# Patient Record
Sex: Male | Born: 1965 | Race: Black or African American | Hispanic: No | Marital: Married | Smoking: Never smoker
Health system: Southern US, Community
[De-identification: ages and names within clinical notes are randomized; demographics above are authoritative.]

## PROBLEM LIST (undated history)

## (undated) DIAGNOSIS — D72828 Other elevated white blood cell count: Secondary | ICD-10-CM

## (undated) DIAGNOSIS — D729 Disorder of white blood cells, unspecified: Secondary | ICD-10-CM

## (undated) DIAGNOSIS — I10 Essential (primary) hypertension: Secondary | ICD-10-CM

## (undated) HISTORY — DX: Disorder of white blood cells, unspecified: D72.9

## (undated) HISTORY — DX: Essential (primary) hypertension: I10

## (undated) HISTORY — DX: Other elevated white blood cell count: D72.828

---

## 2008-06-17 ENCOUNTER — Emergency Department (HOSPITAL_COMMUNITY): Admission: EM | Admit: 2008-06-17 | Discharge: 2008-06-17 | Payer: Self-pay | Admitting: Family Medicine

## 2008-06-21 ENCOUNTER — Emergency Department (HOSPITAL_COMMUNITY): Admission: EM | Admit: 2008-06-21 | Discharge: 2008-06-21 | Payer: Self-pay | Admitting: Emergency Medicine

## 2009-02-11 ENCOUNTER — Emergency Department (HOSPITAL_COMMUNITY): Admission: EM | Admit: 2009-02-11 | Discharge: 2009-02-11 | Payer: Self-pay | Admitting: Family Medicine

## 2009-02-13 ENCOUNTER — Emergency Department (HOSPITAL_COMMUNITY): Admission: EM | Admit: 2009-02-13 | Discharge: 2009-02-13 | Payer: Self-pay | Admitting: Family Medicine

## 2009-02-25 ENCOUNTER — Encounter: Payer: Self-pay | Admitting: Family Medicine

## 2010-05-20 ENCOUNTER — Ambulatory Visit: Payer: Self-pay | Admitting: Family Medicine

## 2010-05-20 DIAGNOSIS — I10 Essential (primary) hypertension: Secondary | ICD-10-CM | POA: Insufficient documentation

## 2010-05-20 HISTORY — DX: Essential (primary) hypertension: I10

## 2010-07-03 ENCOUNTER — Ambulatory Visit: Payer: Self-pay | Admitting: Family Medicine

## 2010-07-03 DIAGNOSIS — D72829 Elevated white blood cell count, unspecified: Secondary | ICD-10-CM

## 2010-07-03 LAB — CONVERTED CEMR LAB
ALT: 13 units/L (ref 0–53)
AST: 19 units/L (ref 0–37)
BUN: 9 mg/dL (ref 6–23)
Basophils Relative: 0.2 % (ref 0.0–3.0)
Bilirubin, Direct: 0 mg/dL (ref 0.0–0.3)
Chloride: 98 meq/L (ref 96–112)
Eosinophils Relative: 1.1 % (ref 0.0–5.0)
GFR calc non Af Amer: 88.87 mL/min (ref >60)
HCT: 39.3 % (ref 39.0–52.0)
MCV: 86.5 fL (ref 78.0–100.0)
Monocytes Relative: 6.5 % (ref 3.0–12.0)
Neutrophils Relative %: 15.5 % — ABNORMAL LOW (ref 43.0–77.0)
Platelets: 248 10*3/uL (ref 150.0–400.0)
Potassium: 3.7 meq/L (ref 3.5–5.1)
RBC: 4.55 M/uL (ref 4.22–5.81)
Total Bilirubin: 0.4 mg/dL (ref 0.3–1.2)
Total Protein: 7.1 g/dL (ref 6.0–8.3)
WBC: 22.2 10*3/uL (ref 4.5–10.5)

## 2010-07-14 ENCOUNTER — Telehealth: Payer: Self-pay | Admitting: Family Medicine

## 2010-07-14 ENCOUNTER — Ambulatory Visit: Payer: Self-pay | Admitting: Internal Medicine

## 2010-07-27 ENCOUNTER — Other Ambulatory Visit
Admission: RE | Admit: 2010-07-27 | Discharge: 2010-07-27 | Payer: Self-pay | Source: Home / Self Care | Attending: Internal Medicine | Admitting: Internal Medicine

## 2010-07-27 ENCOUNTER — Encounter: Payer: Self-pay | Admitting: Family Medicine

## 2010-07-27 LAB — COMPREHENSIVE METABOLIC PANEL
Albumin: 4.1 g/dL (ref 3.5–5.2)
Alkaline Phosphatase: 65 U/L (ref 39–117)
BUN: 14 mg/dL (ref 6–23)
Creatinine, Ser: 1.26 mg/dL (ref 0.40–1.50)
Glucose, Bld: 124 mg/dL — ABNORMAL HIGH (ref 70–99)
Potassium: 3.8 mEq/L (ref 3.5–5.3)
Total Bilirubin: 0.4 mg/dL (ref 0.3–1.2)

## 2010-07-27 LAB — CBC WITH DIFFERENTIAL/PLATELET
BASO%: 0.5 % (ref 0.0–2.0)
EOS%: 0.6 % (ref 0.0–7.0)
HCT: 39.2 % (ref 38.4–49.9)
LYMPH%: 79.6 % — ABNORMAL HIGH (ref 14.0–49.0)
MCH: 28.4 pg (ref 27.2–33.4)
MCHC: 33.1 g/dL (ref 32.0–36.0)
MCV: 86 fL (ref 79.3–98.0)
MONO%: 4.8 % (ref 0.0–14.0)
NEUT%: 14.5 % — ABNORMAL LOW (ref 39.0–75.0)
Platelets: 241 10*3/uL (ref 140–400)
RBC: 4.55 10*6/uL (ref 4.20–5.82)

## 2010-07-30 LAB — FLOW CYTOMETRY

## 2010-08-20 ENCOUNTER — Ambulatory Visit: Payer: Self-pay | Admitting: Internal Medicine

## 2010-09-15 NOTE — Assessment & Plan Note (Signed)
Summary: PT EST // RS   Vital Signs:  Patient profile:   45 year old male Weight:      281 pounds O2 Sat:      95 % Temp:     98.9 degrees F Pulse rate:   94 / minute BP sitting:   164 / 124  (left arm) Cuff size:   large  Vitals Entered By: Pura Spice, RN (May 20, 2010 11:39 AM) CC: to est transferring from Dr Benedetto Goad . Ran out BP meds 4 wks ago    History of Present Illness: 45 yr old male to establish with [place] after transfering from Dr. Benedetto Goad. He feels fine, but has had trouble keeping his BP down. He was diagnosed with HTN one year ago when he went to Urgent Care with HAs. At that time his BP was 240/160. He was started on Lisinopril HCT, and his BP came down nicely. He ran out of meds 4 weeks ago, however, so of course his BP is back up now. The last time it was checked before the meds ran out it was 130/90. He denies any HAs, SOB, or chest pains. He knows he needs to lose weight, and in fact he has lost about 20 lbs in the last 6 months. His last cpx with labs was in December 2010.   Preventive Screening-Counseling & Management  Alcohol-Tobacco     Smoking Status: never  Allergies (verified): No Known Drug Allergies  Past History:  Past Medical History: Hypertension  Past Surgical History: Denies surgical history  Family History: Reviewed history and no changes required. Family History Diabetes 1st degree relative Family History Hypertension  Social History: Reviewed history and no changes required. Occupation: Education officer, environmental Never Smoked Alcohol use-no Married Occupation:  employed Smoking Status:  never  Review of Systems  The patient denies anorexia, fever, weight gain, vision loss, decreased hearing, hoarseness, chest pain, syncope, dyspnea on exertion, peripheral edema, prolonged cough, headaches, hemoptysis, abdominal pain, melena, hematochezia, severe indigestion/heartburn, hematuria, incontinence, genital sores, muscle weakness, suspicious  skin lesions, transient blindness, difficulty walking, depression, unusual weight change, abnormal bleeding, enlarged lymph nodes, angioedema, breast masses, and testicular masses.    Physical Exam  General:  overweight-appearing.   Neck:  No deformities, masses, or tenderness noted. Lungs:  Normal respiratory effort, chest expands symmetrically. Lungs are clear to auscultation, no crackles or wheezes. Heart:  Normal rate and regular rhythm. S1 and S2 normal without gallop, murmur, click, rub or other extra sounds. Extremities:  no edema   Impression & Recommendations:  Problem # 1:  HYPERTENSION (ICD-401.9)  The following medications were removed from the medication list:    Lisinopril-hydrochlorothiazide 20-12.5 Mg Tabs (Lisinopril-hydrochlorothiazide) .Marland Kitchen... 1 once daily His updated medication list for this problem includes:    Losartan Potassium-hctz 100-12.5 Mg Tabs (Losartan potassium-hctz) ..... Once daily  Complete Medication List: 1)  Klor-con M20 20 Meq Cr-tabs (Potassium chloride crys cr) .Marland Kitchen.. 1 once daily 2)  Losartan Potassium-hctz 100-12.5 Mg Tabs (Losartan potassium-hctz) .... Once daily  Patient Instructions: 1)  Please schedule a follow-up appointment in 1 month.  2)  It is important that you exercise reguarly at least 20 minutes 5 times a week. If you develop chest pain, have severe difficulty breathing, or feel very tired, stop exercising immediately and seek medical attention.  3)  You need to lose weight. Consider a lower calorie diet and regular exercise.  4)  Advised him to keep his daily sodium intake below 2000  mg. Prescriptions: LOSARTAN POTASSIUM-HCTZ 100-12.5 MG TABS (LOSARTAN POTASSIUM-HCTZ) once daily  #30 x 11   Entered and Authorized by:   Nelwyn Salisbury MD   Signed by:   Nelwyn Salisbury MD on 05/20/2010   Method used:   Electronically to        [facility] [phone]* (retail)       [address]       [place], [place]  [address]       Ph: [phone] or  [phone]       Fax: [phone]   RxID:   [id number] KLOR-CON M20 20 MEQ CR-TABS (POTASSIUM CHLORIDE CRYS CR) 1 once daily  #30 x 11   Entered and Authorized by:   Nelwyn Salisbury MD   Signed by:   Nelwyn Salisbury MD on 05/20/2010   Method used:   Electronically to        [facility] [phone]* (retail)       [address]       [place], [place]  [address]       Ph: [phone] or [phone]       Fax: [phone]   RxID:   [id number]

## 2010-09-15 NOTE — Letter (Signed)
Summary: Records from [facility]  Records from [facility]   Imported By: Maryln Gottron 06/16/2010 13:18:11  _____________________________________________________________________  External Attachment:    Type:   Image     Comment:   External Document

## 2010-09-15 NOTE — Assessment & Plan Note (Signed)
Summary: 1 MONTH F/U//ALP/PT RSC/CJR----PT [facility] // RS   Vital Signs:  Patient profile:   45 year old male Weight:      290 pounds O2 Sat:      98 % Temp:     98.6 degrees F Pulse rate:   86 / minute BP sitting:   168 / 110  (left arm) Cuff size:   large  Vitals Entered By: Pura Spice, RN (July 03, 2010 10:11 AM) CC: reck BP    History of Present Illness: here to follow up on HTN. After we changed his meds last time, he has felt fine and he is trying to lose some weight. His BP has not changed much however.   Allergies: No Known Drug Allergies  Past History:  Past Medical History: Reviewed history from 05/20/2010 and no changes required. Hypertension  Review of Systems  The patient denies anorexia, fever, weight loss, weight gain, vision loss, decreased hearing, hoarseness, chest pain, syncope, dyspnea on exertion, peripheral edema, prolonged cough, headaches, hemoptysis, abdominal pain, melena, hematochezia, severe indigestion/heartburn, hematuria, incontinence, genital sores, muscle weakness, suspicious skin lesions, transient blindness, difficulty walking, depression, unusual weight change, abnormal bleeding, enlarged lymph nodes, angioedema, breast masses, and testicular masses.    Physical Exam  General:  overweight-appearing.   Neck:  No deformities, masses, or tenderness noted. Lungs:  Normal respiratory effort, chest expands symmetrically. Lungs are clear to auscultation, no crackles or wheezes. Heart:  Normal rate and regular rhythm. S1 and S2 normal without gallop, murmur, click, rub or other extra sounds.   Impression & Recommendations:  Problem # 1:  HYPERTENSION (ICD-401.9)  His updated medication list for this problem includes:    Losartan Potassium-hctz 100-12.5 Mg Tabs (Losartan potassium-hctz) ..... Once daily    Amlodipine Besylate 10 Mg Tabs (Amlodipine besylate) ..... Once daily  Orders: UA Dipstick w/o Micro (automated)   (81003) Venipuncture (21308) TLB-BMP (Basic Metabolic Panel-BMET) (80048-METABOL) TLB-CBC Platelet - w/Differential (85025-CBCD) TLB-Hepatic/Liver Function Pnl (80076-HEPATIC) TLB-TSH (Thyroid Stimulating Hormone) (84443-TSH)  Complete Medication List: 1)  Klor-con M20 20 Meq Cr-tabs (Potassium chloride crys cr) .Marland Kitchen.. 1 once daily 2)  Losartan Potassium-hctz 100-12.5 Mg Tabs (Losartan potassium-hctz) .... Once daily 3)  Amlodipine Besylate 10 Mg Tabs (Amlodipine besylate) .... Once daily  Patient Instructions: 1)  Please schedule a follow-up appointment in 1 month.  2)  It is important that you exercise reguarly at least 20 minutes 5 times a week. If you develop chest pain, have severe difficulty breathing, or feel very tired, stop exercising immediately and seek medical attention.  3)  You need to lose weight. Consider a lower calorie diet and regular exercise 4)  Add Amlodipine. 5)  get labs today Prescriptions: AMLODIPINE BESYLATE 10 MG TABS (AMLODIPINE BESYLATE) once daily  #30 x 11   Entered and Authorized by:   Nelwyn Salisbury MD   Signed by:   Nelwyn Salisbury MD on 07/03/2010   Method used:   [facility]ronically to        [facility] [phone][facility] (retail)       [address]       [place], [place]  [address]       Ph: [phone] or [phone]       Fax: [phone]   RxID:   [id number]    Orders Added: 1)  Est. Patient Level IV [41660] 2)  UA Dipstick w/o Micro (automated)  [81003] 3)  Venipuncture [63016] 4)  TLB-BMP (Basic Metabolic Panel-BMET) [80048-METABOL] 5)  TLB-CBC Platelet - w/Differential [85025-CBCD] 6)  TLB-Hepatic/Liver Function Pnl [80076-HEPATIC] 7)  TLB-TSH (Thyroid Stimulating Hormone) [84166-AYT]  Appended Document: Orders Update    Clinical Lists Changes  Orders: Added new Service order of Specimen Handling (01601) - Signed Observations: Added new observation of COMMENTS: Wynona Canes, CMA  July 03, 2010 12:10 PM  (07/03/2010 10:59) Added  new observation of PH URINE: 7.0  (07/03/2010 10:59) Added new observation of SPEC GR URIN: 1.020  (07/03/2010 10:59) Added new observation of APPEARANCE U: Clear  (07/03/2010 10:59) Added new observation of UA COLOR: yellow  (07/03/2010 10:59) Added new observation of WBC DIPSTK U: negative  (07/03/2010 10:59) Added new observation of NITRITE URN: negative  (07/03/2010 10:59) Added new observation of UROBILINOGEN: 0.2  (07/03/2010 10:59) Added new observation of PROTEIN, URN: negative  (07/03/2010 10:59) Added new observation of BLOOD UR DIP: 2+  (07/03/2010 10:59) Added new observation of KETONES URN: negative  (07/03/2010 10:59) Added new observation of BILIRUBIN UR: negative  (07/03/2010 10:59) Added new observation of GLUCOSE, URN: negative  (07/03/2010 10:59)      Laboratory Results   Urine Tests  Date/Time Recieved: July 03, 2010 12:10 PM  Date/Time Reported: July 03, 2010 12:10 PM   Routine Urinalysis   Color: yellow Appearance: Clear Glucose: negative   (Normal Range: Negative) Bilirubin: negative   (Normal Range: Negative) Ketone: negative   (Normal Range: Negative) Spec. Gravity: 1.020   (Normal Range: 1.003-1.035) Blood: 2+   (Normal Range: Negative) pH: 7.0   (Normal Range: 5.0-8.0) Protein: negative   (Normal Range: Negative) Urobilinogen: 0.2   (Normal Range: 0-1) Nitrite: negative   (Normal Range: Negative) Leukocyte Esterace: negative   (Normal Range: Negative)    Comments: Wynona Canes, CMA  July 03, 2010 12:10 PM      Appended Document: 1 MONTH F/U//ALP/PT RSC/CJR----PT [facility] // RS his urine tested positive for a little blood in it. We will retest with a more sensitive test the next time he comes in   Appended Document: 1 MONTH F/U//ALP/PT RSC/CJR----PT [facility] // RS pt aware of urine results

## 2010-09-15 NOTE — Progress Notes (Signed)
Summary: REQUEST FOR RETURN CALL  Phone Note Call from Patient   Caller: Patient [phone] Summary of Call: Pt called to speak with Almira Coaster, RN..... Pt adv that he has questions concerning recent labwork and referral to hematology.... Requests a return call to [phone].  Initial call taken by: Debbra Riding,  July 14, 2010 3:58 PM  Follow-up for Phone Call        left mess to return call  Follow-up by: Pura Spice, RN,  July 14, 2010 5:00 PM  Additional Follow-up for Phone Call Additional follow up Details #1::        pt never returned call  Additional Follow-up by: Pura Spice, RN,  July 17, 2010 9:24 AM

## 2010-09-17 NOTE — Letter (Signed)
Summary: [facility]  [facility]   Imported By: Maryln Gottron 08/13/2010 13:16:54  _____________________________________________________________________  External Attachment:    Type:   Image     Comment:   External Document

## 2010-11-23 LAB — POCT I-STAT, CHEM 8
Chloride: 100 mEq/L (ref 96–112)
Creatinine, Ser: 1.2 mg/dL (ref 0.4–1.5)
Hemoglobin: 15 g/dL (ref 13.0–17.0)
Potassium: 3 mEq/L — ABNORMAL LOW (ref 3.5–5.1)
Sodium: 142 mEq/L (ref 135–145)

## 2010-11-24 ENCOUNTER — Encounter: Payer: Self-pay | Admitting: Family Medicine

## 2010-11-26 ENCOUNTER — Ambulatory Visit: Payer: Self-pay | Admitting: Family Medicine

## 2010-12-04 ENCOUNTER — Other Ambulatory Visit: Payer: Self-pay | Admitting: Family Medicine

## 2010-12-04 ENCOUNTER — Ambulatory Visit (INDEPENDENT_AMBULATORY_CARE_PROVIDER_SITE_OTHER): Payer: 59 | Admitting: Family Medicine

## 2010-12-04 ENCOUNTER — Other Ambulatory Visit: Payer: 59

## 2010-12-04 ENCOUNTER — Encounter: Payer: Self-pay | Admitting: Family Medicine

## 2010-12-04 VITALS — BP 126/80 | HR 80 | Temp 98.5°F | Wt 281.0 lb

## 2010-12-04 DIAGNOSIS — D72829 Elevated white blood cell count, unspecified: Secondary | ICD-10-CM

## 2010-12-04 DIAGNOSIS — I10 Essential (primary) hypertension: Secondary | ICD-10-CM

## 2010-12-04 LAB — CBC WITH DIFFERENTIAL/PLATELET
Basophils Absolute: 0 10*3/uL (ref 0.0–0.1)
Eosinophils Absolute: 0.2 10*3/uL (ref 0.0–0.7)
Eosinophils Relative: 1 % (ref 0–5)
Lymphocytes Relative: 82 % — ABNORMAL HIGH (ref 12–46)
Lymphs Abs: 23 10*3/uL — ABNORMAL HIGH (ref 0.7–4.0)
MCH: 27.9 pg (ref 26.0–34.0)
MCV: 87.9 fL (ref 78.0–100.0)
Neutrophils Relative %: 14 % — ABNORMAL LOW (ref 43–77)
Platelets: 248 10*3/uL (ref 150–400)
RBC: 4.63 MIL/uL (ref 4.22–5.81)
RDW: 13.5 % (ref 11.5–15.5)
WBC: 27.9 10*3/uL — ABNORMAL HIGH (ref 4.0–10.5)

## 2010-12-04 LAB — POCT URINALYSIS DIPSTICK
Nitrite, UA: NEGATIVE
Spec Grav, UA: 1.02
Urobilinogen, UA: 0.2
pH, UA: 7

## 2010-12-04 LAB — BASIC METABOLIC PANEL
CO2: 33 mEq/L — ABNORMAL HIGH (ref 19–32)
Chloride: 98 mEq/L (ref 96–112)
Glucose, Bld: 85 mg/dL (ref 70–99)
Potassium: 3.6 mEq/L (ref 3.5–5.1)
Sodium: 141 mEq/L (ref 135–145)

## 2010-12-04 LAB — LDL CHOLESTEROL, DIRECT: Direct LDL: 99 mg/dL

## 2010-12-04 LAB — HEPATIC FUNCTION PANEL
AST: 19 U/L (ref 0–37)
Albumin: 3.9 g/dL (ref 3.5–5.2)
Alkaline Phosphatase: 59 U/L (ref 39–117)
Total Protein: 7.2 g/dL (ref 6.0–8.3)

## 2010-12-04 NOTE — Progress Notes (Signed)
  Subjective:    Patient ID: Joseph Greene, male    DOB: Jun 18, 1966, 45 y.o.   MRN: [id number]  HPI Here for HTN and to follow up leukocytosis. He is slowly losing weight, and his BP is stable. He feels good with no complaints. He saw Dr. Eugenia Mcalpine on 07-27-10 after we got a CBC showing a WBC of 22.2 and a lymphocytosis. He had no symptoms at that time. A repeat WBC count on 07-28-11 was 23.8 again with lymphocytosis.  Dr. Aura Fey thought this may represent chronic lymphocytic leukemia, but this was unclear. He recommended simply repeating a CBC in about 6 months. Unfortunately the patient has not returned for follow up until now.    Review of Systems  Constitutional: Negative.   Respiratory: Negative.   Cardiovascular: Negative.   Gastrointestinal: Negative.   Skin: Negative.   Hematological: Negative.        Objective:   Physical Exam  Constitutional: He appears well-developed and well-nourished.  Neck: No thyromegaly present.  Cardiovascular: Normal rate, regular rhythm, normal heart sounds and intact distal pulses.   Pulmonary/Chest: Effort normal and breath sounds normal.  Abdominal:       No supraclavicular or axillary or inguinal adenopathy  Lymphadenopathy:    He has no cervical adenopathy.          Assessment & Plan:  His HTN is stable. We will repeat a CBC today and go from there.

## 2010-12-07 ENCOUNTER — Telehealth: Payer: Self-pay | Admitting: Family Medicine

## 2010-12-07 NOTE — Progress Notes (Signed)
Pt Informed. Stressed the importance of f/u with Dr Mohammed @[facility] for possible Leukemia Dx. Gave Pt their phone#.  

## 2010-12-07 NOTE — Progress Notes (Signed)
Pt Informed. Stressed the importance of f/u with Dr Shirline Frees @[facility]  for possible Leukemia Dx. Gave Pt their phone#.

## 2010-12-07 NOTE — Telephone Encounter (Signed)
Message copied by Burnard Leigh on Mon Dec 07, 2010  9:46 AM ------      Message from: Dwaine Deter      Created: Mon Dec 07, 2010  8:30 AM       His WBC is still quite elevated, most of which are still lymphocytes. He needs to follow up with Dr. Shirline Frees in Oncology soon

## 2010-12-07 NOTE — Telephone Encounter (Signed)
Pt. Informed.

## 2010-12-23 ENCOUNTER — Encounter: Payer: 59 | Admitting: *Deleted

## 2011-06-04 ENCOUNTER — Other Ambulatory Visit: Payer: Self-pay | Admitting: Family Medicine

## 2011-06-07 NOTE — Telephone Encounter (Signed)
Script sent e-scribe 

## 2011-11-02 ENCOUNTER — Other Ambulatory Visit: Payer: Self-pay | Admitting: Family Medicine

## 2012-07-22 ENCOUNTER — Other Ambulatory Visit: Payer: Self-pay | Admitting: Family Medicine

## 2012-07-24 NOTE — Telephone Encounter (Signed)
Call in #30 only. He needs an OV  

## 2012-07-24 NOTE — Telephone Encounter (Signed)
Can we refill this? 

## 2012-09-11 ENCOUNTER — Telehealth: Payer: Self-pay | Admitting: Family Medicine

## 2012-09-11 NOTE — Telephone Encounter (Signed)
Pt BP has been running high. He had CPX at work, and his bp 214/124. He is there now, and is being watched by [facility] (on site). They want him to follow up w/ primary ASAP. Can I use a same day appt for him tomorrow ? Pls advise?

## 2012-09-11 NOTE — Telephone Encounter (Signed)
Pt aware/appt set 09/12/12 @ 11am

## 2012-09-11 NOTE — Telephone Encounter (Signed)
yes

## 2012-09-12 ENCOUNTER — Ambulatory Visit (INDEPENDENT_AMBULATORY_CARE_PROVIDER_SITE_OTHER): Payer: 59 | Admitting: Family Medicine

## 2012-09-12 ENCOUNTER — Encounter: Payer: Self-pay | Admitting: Family Medicine

## 2012-09-12 ENCOUNTER — Other Ambulatory Visit: Payer: Self-pay | Admitting: Family Medicine

## 2012-09-12 ENCOUNTER — Ambulatory Visit: Payer: 59

## 2012-09-12 VITALS — BP 172/120 | HR 102 | Temp 98.5°F | Wt 294.0 lb

## 2012-09-12 DIAGNOSIS — D72829 Elevated white blood cell count, unspecified: Secondary | ICD-10-CM

## 2012-09-12 DIAGNOSIS — I10 Essential (primary) hypertension: Secondary | ICD-10-CM

## 2012-09-12 LAB — TSH: TSH: 0.87 u[IU]/mL (ref 0.35–5.50)

## 2012-09-12 LAB — BASIC METABOLIC PANEL
BUN: 11 mg/dL (ref 6–23)
Calcium: 9.3 mg/dL (ref 8.4–10.5)
Chloride: 99 mEq/L (ref 96–112)
Creatinine, Ser: 1.1 mg/dL (ref 0.4–1.5)

## 2012-09-12 LAB — CBC WITH DIFFERENTIAL/PLATELET
Basophils Absolute: 0 10*3/uL (ref 0.0–0.1)
Eosinophils Relative: 0.5 % (ref 0.0–5.0)
HCT: 41 % (ref 39.0–52.0)
Lymphs Abs: 25 10*3/uL — ABNORMAL HIGH (ref 0.7–4.0)
Monocytes Absolute: 2 10*3/uL — ABNORMAL HIGH (ref 0.1–1.0)
Monocytes Relative: 6.6 % (ref 3.0–12.0)
Neutrophils Relative %: 10.9 % — ABNORMAL LOW (ref 43.0–77.0)
Platelets: 248 10*3/uL (ref 150.0–400.0)
RDW: 15.3 % — ABNORMAL HIGH (ref 11.5–14.6)
WBC: 30.5 10*3/uL (ref 4.5–10.5)

## 2012-09-12 MED ORDER — LOSARTAN POTASSIUM-HCTZ 100-25 MG PO TABS: 1.0000 | ORAL_TABLET | Freq: Every day | ORAL | Status: DC

## 2012-09-12 MED ORDER — AMLODIPINE BESYLATE 10 MG PO TABS: 10.0000 mg | ORAL_TABLET | Freq: Every day | ORAL | Status: DC

## 2012-09-12 NOTE — Progress Notes (Signed)
  Subjective:    Patient ID: Joseph Greene, male    DOB: 1966/08/03, 47 y.o.   MRN: [id number]  HPI Here for elevated BP. He was last seen here in April 2012, and his BP was well controlled at that time. He is supposed to be on one Losartan HCT a day and one Amlodipine a day. Unfortunately when he tried to refill the Amlodipine a month ago, he was given a bottle of Losartan HCT instead and he did not notice this. As a result he has been taking a double dose of Losartan HCT and no Amlodipine. He had a work screening yesterday and his BP was measured at 180 to 220 over 120. He has felt fine with no symptoms. Also he had been seeing Dr. Shirline Frees for leukocytosis, with the last WBC being 27.9 in April of 2012. 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 However he has not been back to Oncology since then. He admits to a poor diet lately, and he has gained 13 lbs since the last visit.    Review of Systems  Constitutional: Negative.   Respiratory: Negative.   Cardiovascular: Negative.        Objective:   Physical Exam  Constitutional: He appears well-developed and well-nourished. No distress.  Neck: No thyromegaly present.  Cardiovascular: Normal rate, regular rhythm, normal heart sounds and intact distal pulses.   Pulmonary/Chest: Effort normal and breath sounds normal.  Musculoskeletal: He exhibits no edema.  Lymphadenopathy:    He has no cervical adenopathy.          Assessment & Plan:  For the HTN, we will get back on Amlodipine 10 mg a day and we will change the Losartan HCT to 100/25 to take once a day. Recheck here in 3 weeks. Get labs today including a CBC.

## 2012-09-13 LAB — PATHOLOGIST SMEAR REVIEW

## 2012-09-14 ENCOUNTER — Telehealth: Payer: Self-pay | Admitting: Family Medicine

## 2012-09-14 DIAGNOSIS — I10 Essential (primary) hypertension: Secondary | ICD-10-CM

## 2012-09-14 DIAGNOSIS — E669 Obesity, unspecified: Secondary | ICD-10-CM

## 2012-09-14 NOTE — Telephone Encounter (Signed)
Pt left a voice message, wants the referral for a Nutritionist.

## 2012-09-14 NOTE — Telephone Encounter (Signed)
I left voice message with below information. 

## 2012-09-14 NOTE — Telephone Encounter (Signed)
The referral was done  

## 2012-09-18 ENCOUNTER — Telehealth: Payer: Self-pay | Admitting: *Deleted

## 2012-09-18 DIAGNOSIS — D72829 Elevated white blood cell count, unspecified: Secondary | ICD-10-CM

## 2012-09-18 MED ORDER — POTASSIUM CHLORIDE CRYS ER 20 MEQ PO TBCR: 20.0000 meq | EXTENDED_RELEASE_TABLET | Freq: Every day | ORAL | Status: DC

## 2012-09-18 NOTE — Addendum Note (Signed)
Addended by: Aniceto Boss A on: 09/18/2012 01:37 PM   Modules accepted: Orders

## 2012-09-18 NOTE — Progress Notes (Signed)
Quick Note:  I spoke with pt and sent script e-scribe, also released results to my chart. ______

## 2012-09-18 NOTE — Telephone Encounter (Signed)
Pt called stating he saw Dr Clent Ridges and his lab work is getting worse so he needs to see Dr Donnald Garre again.  Dr Donnald Garre reviewed his office note from his visit with pt 07/2010 and lab work that was done 09/12/12 by Dr Clent Ridges.  Per Dr Donnald Garre, okay for pt to f/u with lab and f/u in 2 weeks.  SLJ

## 2012-09-19 ENCOUNTER — Telehealth: Payer: Self-pay | Admitting: Internal Medicine

## 2012-09-19 NOTE — Telephone Encounter (Signed)
lmonvm adviisng the pt of his 2 week f/u appt with dr Arbutus Ped

## 2012-09-30 ENCOUNTER — Other Ambulatory Visit: Payer: Self-pay

## 2012-10-04 ENCOUNTER — Ambulatory Visit (HOSPITAL_BASED_OUTPATIENT_CLINIC_OR_DEPARTMENT_OTHER): Payer: 59 | Admitting: Internal Medicine

## 2012-10-04 ENCOUNTER — Encounter: Payer: Self-pay | Admitting: Internal Medicine

## 2012-10-04 ENCOUNTER — Other Ambulatory Visit: Payer: 59

## 2012-10-04 VITALS — BP 121/79 | HR 98 | Temp 98.2°F | Resp 20 | Ht 77.0 in | Wt 291.0 lb

## 2012-10-04 DIAGNOSIS — D72829 Elevated white blood cell count, unspecified: Secondary | ICD-10-CM

## 2012-10-04 DIAGNOSIS — D47Z9 Other specified neoplasms of uncertain behavior of lymphoid, hematopoietic and related tissue: Secondary | ICD-10-CM

## 2012-10-04 LAB — COMPREHENSIVE METABOLIC PANEL (CC13)
ALT: 18 U/L (ref 0–55)
AST: 16 U/L (ref 5–34)
Creatinine: 1.3 mg/dL (ref 0.7–1.3)
Sodium: 141 mEq/L (ref 136–145)
Total Bilirubin: 0.46 mg/dL (ref 0.20–1.20)
Total Protein: 8.2 g/dL (ref 6.4–8.3)

## 2012-10-04 LAB — MANUAL DIFFERENTIAL
Band Neutrophils: 1 % (ref 0–10)
EOS: 1 % (ref 0–7)
LYMPH: 83 % — ABNORMAL HIGH (ref 14–49)
MONO: 1 % (ref 0–14)
Other Cell: 0 % (ref 0–0)
SEG: 14 % — ABNORMAL LOW (ref 38–77)
nRBC: 0 % (ref 0–0)

## 2012-10-04 LAB — CBC WITH DIFFERENTIAL/PLATELET
MCH: 27.3 pg (ref 27.2–33.4)
MCV: 82.8 fL (ref 79.3–98.0)
RBC: 4.88 10*6/uL (ref 4.20–5.82)
RDW: 14.8 % — ABNORMAL HIGH (ref 11.0–14.6)
WBC: 36 10*3/uL — ABNORMAL HIGH (ref 4.0–10.3)

## 2012-10-04 NOTE — Patient Instructions (Signed)
You continue to have persistent leukocytosis questionable for chronic lymphocytic leukemia. Continue on observation for now with repeat CBC in 3 months

## 2012-10-04 NOTE — Progress Notes (Signed)
[facility] [phone]   [phone]  OFFICE PROGRESS NOTE  Nelwyn Salisbury, MD [address] 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  PRIOR THERAPY: None.  CURRENT THERAPY: None.  INTERVAL HISTORY: Joseph Greene 47 y.o. male returns to the clinic today for follow up visit. The patient was seen for evaluation of leukocytosis in December of 2011. 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 The patient was lost to follow up visit since that time because she was scared about a diagnosis of leukemia. He was seen recently by his primary care physician and repeat blood work showed persistent leukocytosis with increase in the number of the total white blood count compared to previous blood work. He was referred back to me today for evaluation and recommendation regarding his condition. He is feeling fine today with no specific complaints. He denied having any significant weight loss or night sweats. He denied having any chest pain, shortness breath, cough or hemoptysis. He has no palpable lymphadenopathy.  MEDICAL HISTORY: Past Medical History  Diagnosis Date  . HYPERTENSION 05/20/2010  . Neutrophilic leukocytosis     sees Dr. Shirline Frees     ALLERGIES:  has No Known Allergies.  MEDICATIONS:  Current Outpatient Prescriptions  Medication Sig Dispense Refill  . amLODipine (NORVASC) 10 MG tablet Take 1 tablet (10 mg total) by mouth daily.  30 tablet  11  . losartan-hydrochlorothiazide (HYZAAR) 100-25 MG per tablet Take 1 tablet by mouth daily.  30 tablet  11  . potassium chloride SA (K-DUR,KLOR-CON) 20 MEQ tablet Take 1 tablet (20 mEq total) by mouth daily.  30 tablet  11   No  current facility-administered medications for this visit.    REVIEW OF SYSTEMS:  A comprehensive review of systems was negative.   PHYSICAL EXAMINATION: General appearance: alert, cooperative and no distress Head: Normocephalic, without obvious abnormality, atraumatic Neck: no adenopathy Lymph nodes: Cervical, supraclavicular, and axillary nodes normal. Resp: clear to auscultation bilaterally Cardio: regular rate and rhythm, S1, S2 normal, no murmur, click, rub or gallop GI: soft, non-tender; bowel sounds normal; no masses,  no organomegaly Extremities: extremities normal, atraumatic, no cyanosis or edema Neurologic: Alert and oriented X 3, normal strength and tone. Normal symmetric reflexes. Normal coordination and gait  ECOG PERFORMANCE STATUS: 0 - Asymptomatic  Blood pressure 121/79, pulse 98, temperature 98.2 F (36.8 C), temperature source Oral, resp. rate 20, height 6\' 5"  (1.956 m), weight 291 lb (131.997 kg).  LABORATORY DATA: Lab Results  Component Value Date   WBC 30.5 Repeated and verified X2.* 09/12/2012   HGB 13.1 09/12/2012   HCT 41.0 09/12/2012   MCV 84.2 09/12/2012   PLT 248.0 09/12/2012      Chemistry      Component Value Date/Time   NA 139 09/12/2012 1157   K 3.2* 09/12/2012 1157   CL 99 09/12/2012 1157   CO2 33* 09/12/2012 1157   BUN 11 09/12/2012 1157   CREATININE 1.1 09/12/2012 1157      Component Value Date/Time   CALCIUM 9.3 09/12/2012 1157   ALKPHOS 59 12/04/2010 1304   AST 19 12/04/2010 1304   ALT 17 12/04/2010 1304   BILITOT 0.5 12/04/2010 1304       RADIOGRAPHIC STUDIES: No results found.  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  PLAN: I discussed the lab result with the patient. I recommended for him to continue on observation for  now with repeat CBC, comprehensive metabolic panel and LDH in 3 months. I would consider the patient for treatment if he has short doubling timeof his total white blood count less than 6 months.  All questions were answered. The patient knows to call the clinic with any problems, questions or concerns. We can certainly see the patient much sooner if necessary.  I spent 20 minutes counseling the patient face to face. The total time spent in the appointment was 30 minutes.

## 2012-10-04 NOTE — Progress Notes (Signed)
Quick Note:  Call patient with the result and order K Dur 20 meq po qd X 7 ______ 

## 2012-10-05 ENCOUNTER — Other Ambulatory Visit: Payer: Self-pay | Admitting: Physician Assistant

## 2012-10-05 ENCOUNTER — Telehealth: Payer: Self-pay | Admitting: Medical Oncology

## 2012-10-05 DIAGNOSIS — E876 Hypokalemia: Secondary | ICD-10-CM

## 2012-10-05 MED ORDER — POTASSIUM CHLORIDE CRYS ER 20 MEQ PO TBCR: 20.0000 meq | EXTENDED_RELEASE_TABLET | Freq: Every day | ORAL | Status: DC

## 2012-10-05 NOTE — Telephone Encounter (Signed)
Called in rx and left message for pt to pick up rx.

## 2012-10-05 NOTE — Telephone Encounter (Signed)
Message copied by Charma Igo on Thu Oct 05, 2012 10:15 AM ------      Message from: Si Gaul      Created: Wed Oct 04, 2012 11:50 PM       Call patient with the result and order K Dur 20 meq po qd X 7 ------

## 2012-10-07 ENCOUNTER — Telehealth: Payer: Self-pay | Admitting: *Deleted

## 2012-10-07 NOTE — Telephone Encounter (Signed)
I have called and left a message for the patient to call office for new scheduled appts. JMW

## 2012-10-09 ENCOUNTER — Telehealth: Payer: Self-pay | Admitting: *Deleted

## 2012-10-09 NOTE — Telephone Encounter (Signed)
I have called and left a message for the patient to call the office. Need to give patient appts for May.  JMW

## 2012-10-11 ENCOUNTER — Ambulatory Visit: Payer: 59 | Admitting: Dietician

## 2012-10-12 ENCOUNTER — Telehealth: Payer: Self-pay | Admitting: *Deleted

## 2012-10-12 NOTE — Telephone Encounter (Signed)
Mailed calendar   JMW

## 2012-10-12 NOTE — Telephone Encounter (Signed)
I have called and left message for the patient to call me regarding his appts. Need to give appts for May.  JMW

## 2012-10-16 ENCOUNTER — Other Ambulatory Visit: Payer: Self-pay | Admitting: Physician Assistant

## 2012-10-17 ENCOUNTER — Other Ambulatory Visit: Payer: Self-pay | Admitting: Physician Assistant

## 2012-10-23 ENCOUNTER — Encounter: Payer: Self-pay | Admitting: Family Medicine

## 2012-10-23 NOTE — Telephone Encounter (Signed)
I spoke with pharmacy and pt can transfer scripts.

## 2012-10-25 ENCOUNTER — Ambulatory Visit: Payer: 59 | Admitting: Dietician

## 2012-10-31 ENCOUNTER — Telehealth: Payer: Self-pay | Admitting: Family Medicine

## 2012-10-31 NOTE — Telephone Encounter (Signed)
Pt called and stated that he wanted to fu with you regarding transferring his medication, and a issue with his unemployment that you have been assisting him with.

## 2012-10-31 NOTE — Telephone Encounter (Signed)
Pt also needs all 3 scripts sent to [facility] pharmacy ( 90 day supply )

## 2012-11-01 ENCOUNTER — Ambulatory Visit: Payer: 59 | Admitting: *Deleted

## 2012-11-01 MED ORDER — LOSARTAN POTASSIUM-HCTZ 100-25 MG PO TABS: 1.0000 | ORAL_TABLET | Freq: Every day | ORAL | Status: DC

## 2012-11-01 MED ORDER — AMLODIPINE BESYLATE 10 MG PO TABS: 10.0000 mg | ORAL_TABLET | Freq: Every day | ORAL | Status: DC

## 2012-11-01 MED ORDER — POTASSIUM CHLORIDE CRYS ER 20 MEQ PO TBCR: 20.0000 meq | EXTENDED_RELEASE_TABLET | Freq: Every day | ORAL | Status: DC

## 2012-11-01 NOTE — Telephone Encounter (Signed)
I sent scripts e-scribe. 

## 2013-01-01 ENCOUNTER — Ambulatory Visit: Payer: 59 | Admitting: Internal Medicine

## 2013-01-01 ENCOUNTER — Telehealth: Payer: Self-pay | Admitting: Internal Medicine

## 2013-01-01 ENCOUNTER — Other Ambulatory Visit: Payer: 59 | Admitting: Lab

## 2013-01-01 NOTE — Telephone Encounter (Signed)
pt called to r/s appt to June.Joseph KitchenMarland KitchenMarland KitchenDone....pt ok and aware

## 2013-01-15 ENCOUNTER — Other Ambulatory Visit: Payer: Self-pay | Admitting: *Deleted

## 2013-01-15 DIAGNOSIS — D72829 Elevated white blood cell count, unspecified: Secondary | ICD-10-CM

## 2013-01-16 ENCOUNTER — Other Ambulatory Visit: Payer: 59

## 2013-01-16 ENCOUNTER — Ambulatory Visit: Payer: 59 | Admitting: Internal Medicine

## 2013-06-21 ENCOUNTER — Other Ambulatory Visit: Payer: Self-pay

## 2013-12-05 ENCOUNTER — Other Ambulatory Visit: Payer: Self-pay | Admitting: Family Medicine

## 2013-12-06 ENCOUNTER — Telehealth: Payer: Self-pay | Admitting: Family Medicine

## 2013-12-06 NOTE — Telephone Encounter (Signed)
Pt has cpx sch for 01-24-14. Pt will need refills on amlodipine 10 mg,losartan-hctz and potassium chloride 20 meq. Pt was given only 30 pills each [facility]

## 2013-12-10 NOTE — Telephone Encounter (Signed)
I spoke with pt and he will notify pharmacy a few days before he needs refill, I just sent in scripts last week.

## 2013-12-18 ENCOUNTER — Ambulatory Visit: Payer: 59 | Admitting: Family Medicine

## 2013-12-19 ENCOUNTER — Telehealth: Payer: Self-pay | Admitting: Family Medicine

## 2013-12-19 NOTE — Telephone Encounter (Signed)
Pt needs sooner cpx than June 2015. Can I create 30 min slot this month?

## 2013-12-19 NOTE — Telephone Encounter (Signed)
Pt has been sch

## 2013-12-19 NOTE — Telephone Encounter (Signed)
Yes, okay to schedule.

## 2013-12-28 ENCOUNTER — Ambulatory Visit (INDEPENDENT_AMBULATORY_CARE_PROVIDER_SITE_OTHER): Payer: 59 | Admitting: Family Medicine

## 2013-12-28 ENCOUNTER — Encounter: Payer: Self-pay | Admitting: Family Medicine

## 2013-12-28 VITALS — BP 120/80 | Temp 98.8°F | Ht 77.0 in | Wt 294.0 lb

## 2013-12-28 DIAGNOSIS — Z0000 Encounter for general adult medical examination without abnormal findings: Secondary | ICD-10-CM

## 2013-12-28 DIAGNOSIS — Z111 Encounter for screening for respiratory tuberculosis: Secondary | ICD-10-CM

## 2013-12-28 LAB — CBC WITH DIFFERENTIAL/PLATELET
BASOS PCT: 0 % (ref 0.0–3.0)
Basophils Absolute: 0 10*3/uL (ref 0.0–0.1)
EOS PCT: 0.4 % (ref 0.0–5.0)
Eosinophils Absolute: 0.2 10*3/uL (ref 0.0–0.7)
HCT: 39 % (ref 39.0–52.0)
Hemoglobin: 12.5 g/dL — ABNORMAL LOW (ref 13.0–17.0)
Lymphs Abs: 35.9 10*3/uL — ABNORMAL HIGH (ref 0.7–4.0)
MCHC: 32.1 g/dL (ref 30.0–36.0)
MCV: 86.5 fl (ref 78.0–100.0)
Monocytes Absolute: 1.7 10*3/uL — ABNORMAL HIGH (ref 0.1–1.0)
Monocytes Relative: 3.9 % (ref 3.0–12.0)
Neutro Abs: 5.2 10*3/uL (ref 1.4–7.7)
Neutrophils Relative %: 12.1 % — ABNORMAL LOW (ref 43.0–77.0)
Platelets: 263 10*3/uL (ref 150.0–400.0)
RBC: 4.51 Mil/uL (ref 4.22–5.81)
RDW: 14.9 % (ref 11.5–15.5)
WBC: 43 10*3/uL (ref 4.0–10.5)

## 2013-12-28 LAB — POCT URINALYSIS DIPSTICK
Bilirubin, UA: NEGATIVE
Glucose, UA: NEGATIVE
KETONES UA: NEGATIVE
LEUKOCYTES UA: NEGATIVE
Nitrite, UA: NEGATIVE
Protein, UA: NEGATIVE
Spec Grav, UA: 1.02
Urobilinogen, UA: 0.2
pH, UA: 6

## 2013-12-28 LAB — HEPATIC FUNCTION PANEL
ALK PHOS: 64 U/L (ref 39–117)
ALT: 16 U/L (ref 0–53)
AST: 24 U/L (ref 0–37)
Albumin: 4 g/dL (ref 3.5–5.2)
BILIRUBIN TOTAL: 0.6 mg/dL (ref 0.2–1.2)
Bilirubin, Direct: 0.1 mg/dL (ref 0.0–0.3)
Total Protein: 7.4 g/dL (ref 6.0–8.3)

## 2013-12-28 LAB — BASIC METABOLIC PANEL
BUN: 14 mg/dL (ref 6–23)
CHLORIDE: 97 meq/L (ref 96–112)
CO2: 32 mEq/L (ref 19–32)
Calcium: 9.2 mg/dL (ref 8.4–10.5)
Creatinine, Ser: 1.3 mg/dL (ref 0.4–1.5)
GFR: 77.33 mL/min (ref >60.00)
Glucose, Bld: 117 mg/dL — ABNORMAL HIGH (ref 70–99)
Potassium: 3.2 mEq/L — ABNORMAL LOW (ref 3.5–5.1)
Sodium: 140 mEq/L (ref 135–145)

## 2013-12-28 LAB — LIPID PANEL
CHOL/HDL RATIO: 5
Cholesterol: 168 mg/dL (ref 0–200)
HDL: 34 mg/dL — ABNORMAL LOW (ref >39.00)
LDL Cholesterol: 87 mg/dL (ref 0–99)
Triglycerides: 234 mg/dL — ABNORMAL HIGH (ref 0.0–149.0)
VLDL: 46.8 mg/dL — ABNORMAL HIGH (ref 0.0–40.0)

## 2013-12-28 LAB — TSH: TSH: 0.6 u[IU]/mL (ref 0.35–4.50)

## 2013-12-28 LAB — PSA: PSA: 0.86 ng/mL (ref 0.10–4.00)

## 2013-12-28 MED ORDER — LOSARTAN POTASSIUM-HCTZ 100-25 MG PO TABS: ORAL_TABLET | ORAL | Status: DC

## 2013-12-28 MED ORDER — AMLODIPINE BESYLATE 10 MG PO TABS: ORAL_TABLET | ORAL | Status: DC

## 2013-12-28 MED ORDER — POTASSIUM CHLORIDE CRYS ER 20 MEQ PO TBCR: EXTENDED_RELEASE_TABLET | ORAL | Status: DC

## 2013-12-28 NOTE — Progress Notes (Signed)
Pre visit review using our clinic review tool, if applicable. No additional management support is needed unless otherwise documented below in the visit note. 

## 2013-12-28 NOTE — Addendum Note (Signed)
Addended by: Aggie Hacker A on: 12/28/2013 11:59 AM   Modules accepted: Orders

## 2013-12-28 NOTE — Progress Notes (Signed)
   Subjective:    Patient ID: Joseph Greene, male    DOB: 1965-09-26, 48 y.o.   MRN: [id number]  HPI 48 yr old male for a cpx and to fill out a form for his job. He feels well except for some painful gums. He saw his dentist 2 months ago for painful bleeding gums and he was diagnosed with chronic gingivitis. He had a wisdom tooth removed at that visit and was placed on antibiotics for 2 weeks. Now he is scheduled to see Dr. Marian Sorrow in the next few weeks for a deep periodontal cleaning. Interestingly, he has been seeing Dr. Julien Nordmann for a leukocytosis of uncertain etiology, and his dentist thinks his chronic oral infections may be the cause.    Review of Systems  Constitutional: Negative.   HENT: Negative.   Eyes: Negative.   Respiratory: Negative.   Cardiovascular: Negative.   Gastrointestinal: Negative.   Genitourinary: Negative.   Musculoskeletal: Negative.   Skin: Negative.   Neurological: Negative.   Psychiatric/Behavioral: Negative.        Objective:   Physical Exam  Constitutional: He is oriented to person, place, and time. He appears well-developed and well-nourished. No distress.  HENT:  Head: Normocephalic and atraumatic.  Right Ear: External ear normal.  Left Ear: External ear normal.  Nose: Nose normal.  Mouth/Throat: Oropharynx is clear and moist. No oropharyngeal exudate.  Eyes: Conjunctivae and EOM are normal. Pupils are equal, round, and reactive to light. Right eye exhibits no discharge. Left eye exhibits no discharge. No scleral icterus.  Neck: Neck supple. No JVD present. No tracheal deviation present. No thyromegaly present.  Cardiovascular: Normal rate, regular rhythm, normal heart sounds and intact distal pulses.  Exam reveals no gallop and no friction rub.   No murmur heard. Pulmonary/Chest: Effort normal and breath sounds normal. No respiratory distress. He has no wheezes. He has no rales. He exhibits no tenderness.  Abdominal: Soft. Bowel sounds are normal.  He exhibits no distension and no mass. There is no tenderness. There is no rebound and no guarding.  Genitourinary: Rectum normal, prostate normal and penis normal. Guaiac negative stool. No penile tenderness.  Musculoskeletal: Normal range of motion. He exhibits no edema and no tenderness.  Lymphadenopathy:    He has no cervical adenopathy.  Neurological: He is alert and oriented to person, place, and time. He has normal reflexes. No cranial nerve deficit. He exhibits normal muscle tone. Coordination normal.  Skin: Skin is warm and dry. No rash noted. He is not diaphoretic. No erythema. No pallor.  Psychiatric: He has a normal mood and affect. His behavior is normal. Judgment and thought content normal.          Assessment & Plan:  Well exam. Get fasting labs. A PPD is placed. After he gets his gums taken care of, we will repeat a CBC to see if the WBC normalizes.

## 2013-12-31 LAB — TB SKIN TEST
INDURATION: 0 mm
TB Skin Test: NEGATIVE

## 2013-12-31 NOTE — Addendum Note (Signed)
Addended by: Aggie Hacker A on: 12/31/2013 05:16 PM   Modules accepted: Orders

## 2014-01-01 ENCOUNTER — Telehealth: Payer: Self-pay | Admitting: *Deleted

## 2014-01-01 ENCOUNTER — Other Ambulatory Visit: Payer: Self-pay | Admitting: *Deleted

## 2014-01-01 DIAGNOSIS — D72829 Elevated white blood cell count, unspecified: Secondary | ICD-10-CM

## 2014-01-01 NOTE — Progress Notes (Deleted)
Pt called to r/s appt that he missed last year.  Onc tx schedule filled out to see Dr Vista Mink in the next 2-3 weeks.  SLJ

## 2014-01-01 NOTE — Telephone Encounter (Signed)
Dr Sarajane Jews is recommending pt to f/u with Dr Vista Mink.  Pt Joseph Greene for appt in 01/2013.  Per Dr Vista Mink, okay to see pt in 3-4 weeks with labs.  Pt is aware that the schedulers will call the pt.  SLJ

## 2014-01-03 ENCOUNTER — Telehealth: Payer: Self-pay | Admitting: Internal Medicine

## 2014-01-03 NOTE — Telephone Encounter (Signed)
S/w the pt and he is aware of his June 2015 appts

## 2014-01-17 ENCOUNTER — Other Ambulatory Visit: Payer: 59

## 2014-01-24 ENCOUNTER — Telehealth: Payer: Self-pay | Admitting: Internal Medicine

## 2014-01-24 ENCOUNTER — Encounter: Payer: 59 | Admitting: Family Medicine

## 2014-01-24 NOTE — Telephone Encounter (Signed)
returned pt call and r/s appt per pt request...done...pt ok adn aware

## 2014-01-28 ENCOUNTER — Ambulatory Visit (INDEPENDENT_AMBULATORY_CARE_PROVIDER_SITE_OTHER): Payer: 59 | Admitting: Family

## 2014-01-28 ENCOUNTER — Encounter: Payer: Self-pay | Admitting: Family

## 2014-01-28 ENCOUNTER — Telehealth: Payer: Self-pay | Admitting: Family Medicine

## 2014-01-28 VITALS — BP 112/76 | HR 90 | Temp 98.4°F | Wt 298.0 lb

## 2014-01-28 DIAGNOSIS — M545 Low back pain, unspecified: Secondary | ICD-10-CM

## 2014-01-28 DIAGNOSIS — M5416 Radiculopathy, lumbar region: Secondary | ICD-10-CM

## 2014-01-28 DIAGNOSIS — IMO0002 Reserved for concepts with insufficient information to code with codable children: Secondary | ICD-10-CM

## 2014-01-28 MED ORDER — HYDROCODONE-ACETAMINOPHEN 10-325 MG PO TABS: 1.0000 | ORAL_TABLET | Freq: Three times a day (TID) | ORAL | Status: DC | PRN

## 2014-01-28 MED ORDER — DICLOFENAC SODIUM 75 MG PO TBEC: 75.0000 mg | DELAYED_RELEASE_TABLET | Freq: Two times a day (BID) | ORAL | Status: DC

## 2014-01-28 NOTE — Progress Notes (Signed)
Pre visit review using our clinic review tool, if applicable. No additional management support is needed unless otherwise documented below in the visit note. 

## 2014-01-28 NOTE — Progress Notes (Signed)
Subjective:    Patient ID: Joseph Greene, male    DOB: 12-16-1965, 48 y.o.   MRN: [id number]  Back Pain This is a new problem. The current episode started yesterday. The problem occurs constantly. The problem is unchanged. The pain is present in the lumbar spine. The quality of the pain is described as aching. The pain does not radiate. The pain is at a severity of 5/10. The pain is moderate. Worse during: worse with movement.  The symptoms are aggravated by bending, lying down, sitting and twisting. Pertinent negatives include no bowel incontinence, leg pain, numbness or weakness. He has tried heat for the symptoms. The treatment provided mild relief.   Patient reports moving furniture 1 month ago. Has had minimal "discomfort" since that time.    Review of Systems  Constitutional: Negative.   Respiratory: Negative.   Cardiovascular: Negative.   Gastrointestinal: Negative.  Negative for bowel incontinence.  Endocrine: Negative.   Genitourinary: Negative.   Musculoskeletal: Positive for back pain.  Skin: Negative.   Neurological: Negative.  Negative for weakness and numbness.  Psychiatric/Behavioral: Negative.    Past Medical History  Diagnosis Date  . HYPERTENSION 05/20/2010  . Neutrophilic leukocytosis     sees Dr. Earlie Server     History   Social History  . Marital Status: Married    Spouse Name: N/A    Number of Children: N/A  . Years of Education: N/A   Occupational History  . Not on file.   Social History Main Topics  . Smoking status: Never Smoker   . Smokeless tobacco: Never Used  . Alcohol Use: No  . Drug Use: No  . Sexual Activity: Not on file   Other Topics Concern  . Not on file   Social History Narrative  . No narrative on file    No past surgical history on file.  Family History  Problem Relation Age of Onset  . Diabetes    . Hypertension      No Known Allergies  Current Outpatient Prescriptions on File Prior to Visit  Medication Sig  Dispense Refill  . amLODipine (NORVASC) 10 MG tablet TAKE 1 TABLET BY MOUTH DAILY.  90 tablet  3  . losartan-hydrochlorothiazide (HYZAAR) 100-25 MG per tablet TAKE 1 TABLET BY MOUTH DAILY.  90 tablet  3  . potassium chloride SA (K-DUR,KLOR-CON) 20 MEQ tablet 20 mEq 2 (two) times daily.       No current facility-administered medications on file prior to visit.    BP 112/76  Pulse 90  Temp(Src) 98.4 F (36.9 C) (Oral)  Wt 298 lb (135.172 kg)  SpO2 98%chart    Objective:   Physical Exam  Constitutional: He is oriented to person, place, and time. He appears well-developed and well-nourished.  Neck: Normal range of motion. Neck supple.  Cardiovascular: Normal rate, regular rhythm and normal heart sounds.   Pulmonary/Chest: Effort normal and breath sounds normal.  Abdominal: Soft. Bowel sounds are normal.  Musculoskeletal: He exhibits tenderness. He exhibits no edema.  60 degree flexion at the hip before pain is elicited. No tenderness to palpation. Pain with rotation of the trunk. Positive SLR maneuver on the left.   Neurological: He is alert and oriented to person, place, and time.  Skin: Skin is warm and dry.  Psychiatric: He has a normal mood and affect.          Assessment & Plan:   Problem List Items Addressed This Visit   None    Visit  Diagnoses   Lumbar radiculopathy, acute    -  Primary    Low back pain        Relevant Medications       diclofenac (VOLTAREN) EC tablet       HYDROCODONE-ACETAMINOPHEN 10-325 MG PO TABS     Consider MRI of the lumbar spine if no better.

## 2014-01-28 NOTE — Patient Instructions (Signed)
Lumbosacral Radiculopathy Lumbosacral radiculopathy is a pinched nerve or nerves in the low back (lumbosacral area). When this happens you may have weakness in your legs and may not be able to stand on your toes. You may have pain going down into your legs. There may be difficulties with walking normally. There are many causes of this problem. Sometimes this may happen from an injury, or simply from arthritis or boney problems. It may also be caused by other illnesses such as diabetes. If there is no improvement after treatment, further studies may be done to find the exact cause. DIAGNOSIS  X-rays may be needed if the problems become long standing. Electromyograms may be done. This study is one in which the working of nerves and muscles is studied. HOME CARE INSTRUCTIONS   Applications of ice packs may be helpful. Ice can be used in a plastic bag with a towel around it to prevent frostbite to skin. This may be used every 2 hours for 20 to 30 minutes, or as needed, while awake, or as directed by your caregiver.  Only take over-the-counter or prescription medicines for pain, discomfort, or fever as directed by your caregiver.  If physical therapy was prescribed, follow your caregiver's directions. SEEK IMMEDIATE MEDICAL CARE IF:   You have pain not controlled with medications.  You seem to be getting worse rather than better.  You develop increasing weakness in your legs.  You develop loss of bowel or bladder control.  You have difficulty with walking or balance, or develop clumsiness in the use of your legs.  You have a fever. MAKE SURE YOU:   Understand these instructions.  Will watch your condition.  Will get help right away if you are not doing well or get worse. Document Released: 08/02/2005 Document Revised: 10/25/2011 Document Reviewed: 03/22/2008 [facility] Patient Information 2014 [place].   Low Back Sprain with Rehab  A sprain is an injury in which a ligament is  torn. The ligaments of the lower back are vulnerable to sprains. However, they are strong and require great force to be injured. These ligaments are important for stabilizing the spinal column. Sprains are classified into three categories. Grade 1 sprains cause pain, but the tendon is not lengthened. Grade 2 sprains include a lengthened ligament, due to the ligament being stretched or partially ruptured. With grade 2 sprains there is still function, although the function may be decreased. Grade 3 sprains involve a complete tear of the tendon or muscle, and function is usually impaired. SYMPTOMS   Severe pain in the lower back.  Sometimes, a feeling of a "pop," "snap," or tear, at the time of injury.  Tenderness and sometimes swelling at the injury site.  Uncommonly, bruising (contusion) within 48 hours of injury.  Muscle spasms in the back. CAUSES  Low back sprains occur when a force is placed on the ligaments that is greater than they can handle. Common causes of injury include:  Performing a stressful act while off-balance.  Repetitive stressful activities that involve movement of the lower back.  Direct hit (trauma) to the lower back. RISK INCREASES WITH:  Contact sports (football, wrestling).  Collisions (major skiing accidents).  Sports that require throwing or lifting (baseball, weightlifting).  Sports involving twisting of the spine (gymnastics, diving, tennis, golf).  Poor strength and flexibility.  Inadequate protection.  Previous back injury or surgery (especially fusion). PREVENTION  Wear properly fitted and padded protective equipment.  Warm up and stretch properly before activity.  Allow for  adequate recovery between workouts.  Maintain physical fitness:  Strength, flexibility, and endurance.  Cardiovascular fitness.  Maintain a healthy body weight. PROGNOSIS  If treated properly, low back sprains usually heal with non-surgical treatment. The length of  time for healing depends on the severity of the injury.  RELATED COMPLICATIONS   Recurring symptoms, resulting in a chronic problem.  Chronic inflammation and pain in the low back.  Delayed healing or resolution of symptoms, especially if activity is resumed too soon.  Prolonged impairment.  Unstable or arthritic joints of the low back. TREATMENT  Treatment first involves the use of ice and medicine, to reduce pain and inflammation. The use of strengthening and stretching exercises may help reduce pain with activity. These exercises may be performed at home or with a therapist. Severe injuries may require referral to a therapist for further evaluation and treatment, such as ultrasound. Your caregiver may advise that you wear a back brace or corset, to help reduce pain and discomfort. Often, prolonged bed rest results in greater harm then benefit. Corticosteroid injections may be recommended. However, these should be reserved for the most serious cases. It is important to avoid using your back when lifting objects. At night, sleep on your back on a firm mattress, with a pillow placed under your knees. If non-surgical treatment is unsuccessful, surgery may be needed.  MEDICATION   If pain medicine is needed, nonsteroidal anti-inflammatory medicines (aspirin and ibuprofen), or other minor pain relievers (acetaminophen), are often advised.  Do not take pain medicine for 7 days before surgery.  Prescription pain relievers may be given, if your caregiver thinks they are needed. Use only as directed and only as much as you need.  Ointments applied to the skin may be helpful.  Corticosteroid injections may be given by your caregiver. These injections should be reserved for the most serious cases, because they may only be given a certain number of times. HEAT AND COLD  Cold treatment (icing) should be applied for 10 to 15 minutes every 2 to 3 hours for inflammation and pain, and immediately after  activity that aggravates your symptoms. Use ice packs or an ice massage.  Heat treatment may be used before performing stretching and strengthening activities prescribed by your caregiver, physical therapist, or athletic trainer. Use a heat pack or a warm water soak. SEEK MEDICAL CARE IF:   Symptoms get worse or do not improve in 2 to 4 weeks, despite treatment.  You develop numbness or weakness in either leg.  You lose bowel or bladder function.  Any of the following occur after surgery: fever, increased pain, swelling, redness, drainage of fluids, or bleeding in the affected area.  New, unexplained symptoms develop. (Drugs used in treatment may produce side effects.) EXERCISES  RANGE OF MOTION (ROM) AND STRETCHING EXERCISES - Low Back Sprain Most people with lower back pain will find that their symptoms get worse with excessive bending forward (flexion) or arching at the lower back (extension). The exercises that will help resolve your symptoms will focus on the opposite motion.  Your physician, physical therapist or athletic trainer will help you determine which exercises will be most helpful to resolve your lower back pain. Do not complete any exercises without first consulting with your caregiver. Discontinue any exercises which make your symptoms worse, until you speak to your caregiver. If you have pain, numbness or tingling which travels down into your buttocks, leg or foot, the goal of the therapy is for these symptoms to move  closer to your back and eventually resolve. Sometimes, these leg symptoms will get better, but your lower back pain may worsen. This is often an indication of progress in your rehabilitation. Be very alert to any changes in your symptoms and the activities in which you participated in the 24 hours prior to the change. Sharing this information with your caregiver will allow him or her to most efficiently treat your condition. These exercises may help you when  beginning to rehabilitate your injury. Your symptoms may resolve with or without further involvement from your physician, physical therapist or athletic trainer. While completing these exercises, remember:   Restoring tissue flexibility helps normal motion to return to the joints. This allows healthier, less painful movement and activity.  An effective stretch should be held for at least 30 seconds.  A stretch should never be painful. You should only feel a gentle lengthening or release in the stretched tissue. FLEXION RANGE OF MOTION AND STRETCHING EXERCISES: STRETCH  Flexion, Single Knee to Chest   Lie on a firm bed or floor with both legs extended in front of you.  Keeping one leg in contact with the floor, bring your opposite knee to your chest. Hold your leg in place by either grabbing behind your thigh or at your knee.  Pull until you feel a gentle stretch in your low back. Hold __________ seconds.  Slowly release your grasp and repeat the exercise with the opposite side. Repeat __________ times. Complete this exercise __________ times per day.  STRETCH  Flexion, Double Knee to Chest  Lie on a firm bed or floor with both legs extended in front of you.  Keeping one leg in contact with the floor, bring your opposite knee to your chest.  Tense your stomach muscles to support your back and then lift your other knee to your chest. Hold your legs in place by either grabbing behind your thighs or at your knees.  Pull both knees toward your chest until you feel a gentle stretch in your low back. Hold __________ seconds.  Tense your stomach muscles and slowly return one leg at a time to the floor. Repeat __________ times. Complete this exercise __________ times per day.  STRETCH  Low Trunk Rotation  Lie on a firm bed or floor. Keeping your legs in front of you, bend your knees so they are both pointed toward the ceiling and your feet are flat on the floor.  Extend your arms out to the  side. This will stabilize your upper body by keeping your shoulders in contact with the floor.  Gently and slowly drop both knees together to one side until you feel a gentle stretch in your low back. Hold for __________ seconds.  Tense your stomach muscles to support your lower back as you bring your knees back to the starting position. Repeat the exercise to the other side. Repeat __________ times. Complete this exercise __________ times per day  EXTENSION RANGE OF MOTION AND FLEXIBILITY EXERCISES: STRETCH  Extension, Prone on Elbows   Lie on your stomach on the floor, a bed will be too soft. Place your palms about shoulder width apart and at the height of your head.  Place your elbows under your shoulders. If this is too painful, stack pillows under your chest.  Allow your body to relax so that your hips drop lower and make contact more completely with the floor.  Hold this position for __________ seconds.  Slowly return to lying flat on the floor.  Repeat __________ times. Complete this exercise __________ times per day.  RANGE OF MOTION  Extension, Prone Press Ups  Lie on your stomach on the floor, a bed will be too soft. Place your palms about shoulder width apart and at the height of your head.  Keeping your back as relaxed as possible, slowly straighten your elbows while keeping your hips on the floor. You may adjust the placement of your hands to maximize your comfort. As you gain motion, your hands will come more underneath your shoulders.  Hold this position __________ seconds.  Slowly return to lying flat on the floor. Repeat __________ times. Complete this exercise __________ times per day.  RANGE OF MOTION- Quadruped, Neutral Spine   Assume a hands and knees position on a firm surface. Keep your hands under your shoulders and your knees under your hips. You may place padding under your knees for comfort.  Drop your head and point your tailbone toward the ground below  you. This will round out your lower back like an angry cat. Hold this position for __________ seconds.  Slowly lift your head and release your tail bone so that your back sags into a large arch, like an old horse.  Hold this position for __________ seconds.  Repeat this until you feel limber in your low back.  Now, find your "sweet spot." This will be the most comfortable position somewhere between the two previous positions. This is your neutral spine. Once you have found this position, tense your stomach muscles to support your low back.  Hold this position for __________ seconds. Repeat __________ times. Complete this exercise __________ times per day.  STRENGTHENING EXERCISES - Low Back Sprain These exercises may help you when beginning to rehabilitate your injury. These exercises should be done near your "sweet spot." This is the neutral, low-back arch, somewhere between fully rounded and fully arched, that is your least painful position. When performed in this safe range of motion, these exercises can be used for people who have either a flexion or extension based injury. These exercises may resolve your symptoms with or without further involvement from your physician, physical therapist or athletic trainer. While completing these exercises, remember:   Muscles can gain both the endurance and the strength needed for everyday activities through controlled exercises.  Complete these exercises as instructed by your physician, physical therapist or athletic trainer. Increase the resistance and repetitions only as guided.  You may experience muscle soreness or fatigue, but the pain or discomfort you are trying to eliminate should never worsen during these exercises. If this pain does worsen, stop and make certain you are following the directions exactly. If the pain is still present after adjustments, discontinue the exercise until you can discuss the trouble with your caregiver. STRENGTHENING  Deep Abdominals, Pelvic Tilt   Lie on a firm bed or floor. Keeping your legs in front of you, bend your knees so they are both pointed toward the ceiling and your feet are flat on the floor.  Tense your lower abdominal muscles to press your low back into the floor. This motion will rotate your pelvis so that your tail bone is scooping upwards rather than pointing at your feet or into the floor. With a gentle tension and even breathing, hold this position for __________ seconds. Repeat __________ times. Complete this exercise __________ times per day.  STRENGTHENING  Abdominals, Crunches   Lie on a firm bed or floor. Keeping your legs in front of you, bend  your knees so they are both pointed toward the ceiling and your feet are flat on the floor. Cross your arms over your chest.  Slightly tip your chin down without bending your neck.  Tense your abdominals and slowly lift your trunk high enough to just clear your shoulder blades. Lifting higher can put excessive stress on the lower back and does not further strengthen your abdominal muscles.  Control your return to the starting position. Repeat __________ times. Complete this exercise __________ times per day.  STRENGTHENING  Quadruped, Opposite UE/LE Lift   Assume a hands and knees position on a firm surface. Keep your hands under your shoulders and your knees under your hips. You may place padding under your knees for comfort.  Find your neutral spine and gently tense your abdominal muscles so that you can maintain this position. Your shoulders and hips should form a rectangle that is parallel with the floor and is not twisted.  Keeping your trunk steady, lift your right hand no higher than your shoulder and then your left leg no higher than your hip. Make sure you are not holding your breath. Hold this position for __________ seconds.  Continuing to keep your abdominal muscles tense and your back steady, slowly return to your starting  position. Repeat with the opposite arm and leg. Repeat __________ times. Complete this exercise __________ times per day.  STRENGTHENING  Abdominals and Quadriceps, Straight Leg Raise   Lie on a firm bed or floor with both legs extended in front of you.  Keeping one leg in contact with the floor, bend the other knee so that your foot can rest flat on the floor.  Find your neutral spine, and tense your abdominal muscles to maintain your spinal position throughout the exercise.  Slowly lift your straight leg off the floor about 6 inches for a count of 15, making sure to not hold your breath.  Still keeping your neutral spine, slowly lower your leg all the way to the floor. Repeat this exercise with each leg __________ times. Complete this exercise __________ times per day. POSTURE AND BODY MECHANICS CONSIDERATIONS - Low Back Sprain Keeping correct posture when sitting, standing or completing your activities will reduce the stress put on different body tissues, allowing injured tissues a chance to heal and limiting painful experiences. The following are general guidelines for improved posture. Your physician or physical therapist will provide you with any instructions specific to your needs. While reading these guidelines, remember:  The exercises prescribed by your provider will help you have the flexibility and strength to maintain correct postures.  The correct posture provides the best environment for your joints to work. All of your joints have less wear and tear when properly supported by a spine with good posture. This means you will experience a healthier, less painful body.  Correct posture must be practiced with all of your activities, especially prolonged sitting and standing. Correct posture is as important when doing repetitive low-stress activities (typing) as it is when doing a single heavy-load activity (lifting). RESTING POSITIONS Consider which positions are most painful for you  when choosing a resting position. If you have pain with flexion-based activities (sitting, bending, stooping, squatting), choose a position that allows you to rest in a less flexed posture. You would want to avoid curling into a fetal position on your side. If your pain worsens with extension-based activities (prolonged standing, working overhead), avoid resting in an extended position such as sleeping on your stomach. Most  people will find more comfort when they rest with their spine in a more neutral position, neither too rounded nor too arched. Lying on a non-sagging bed on your side with a pillow between your knees, or on your back with a pillow under your knees will often provide some relief. Keep in mind, being in any one position for a prolonged period of time, no matter how correct your posture, can still lead to stiffness. PROPER SITTING POSTURE In order to minimize stress and discomfort on your spine, you must sit with correct posture. Sitting with good posture should be effortless for a healthy body. Returning to good posture is a gradual process. Many people can work toward this most comfortably by using various supports until they have the flexibility and strength to maintain this posture on their own. When sitting with proper posture, your ears will fall over your shoulders and your shoulders will fall over your hips. You should use the back of the chair to support your upper back. Your lower back will be in a neutral position, just slightly arched. You may place a small pillow or folded towel at the base of your lower back for  support.  When working at a desk, create an environment that supports good, upright posture. Without extra support, muscles tire, which leads to excessive strain on joints and other tissues. Keep these recommendations in mind: CHAIR:  A chair should be able to slide under your desk when your back makes contact with the back of the chair. This allows you to work  closely.  The chair's height should allow your eyes to be level with the upper part of your monitor and your hands to be slightly lower than your elbows. BODY POSITION  Your feet should make contact with the floor. If this is not possible, use a foot rest.  Keep your ears over your shoulders. This will reduce stress on your neck and low back. INCORRECT SITTING POSTURES  If you are feeling tired and unable to assume a healthy sitting posture, do not slouch or slump. This puts excessive strain on your back tissues, causing more damage and pain. Healthier options include:  Using more support, like a lumbar pillow.  Switching tasks to something that requires you to be upright or walking.  Talking a brief walk.  Lying down to rest in a neutral-spine position. PROLONGED STANDING WHILE SLIGHTLY LEANING FORWARD  When completing a task that requires you to lean forward while standing in one place for a long time, place either foot up on a stationary 2-4 inch high object to help maintain the best posture. When both feet are on the ground, the lower back tends to lose its slight inward curve. If this curve flattens (or becomes too large), then the back and your other joints will experience too much stress, tire more quickly, and can cause pain. CORRECT STANDING POSTURES Proper standing posture should be assumed with all daily activities, even if they only take a few moments, like when brushing your teeth. As in sitting, your ears should fall over your shoulders and your shoulders should fall over your hips. You should keep a slight tension in your abdominal muscles to brace your spine. Your tailbone should point down to the ground, not behind your body, resulting in an over-extended swayback posture.  INCORRECT STANDING POSTURES  Common incorrect standing postures include a forward head, locked knees and/or an excessive swayback. WALKING Walk with an upright posture. Your ears, shoulders and hips  should all line-up. PROLONGED ACTIVITY IN A FLEXED POSITION When completing a task that requires you to bend forward at your waist or lean over a low surface, try to find a way to stabilize 3 out of 4 of your limbs. You can place a hand or elbow on your thigh or rest a knee on the surface you are reaching across. This will provide you more stability, so that your muscles do not tire as quickly. By keeping your knees relaxed, or slightly bent, you will also reduce stress across your lower back. CORRECT LIFTING TECHNIQUES DO :  Assume a wide stance. This will provide you more stability and the opportunity to get as close as possible to the object which you are lifting.  Tense your abdominals to brace your spine. Bend at the knees and hips. Keeping your back locked in a neutral-spine position, lift using your leg muscles. Lift with your legs, keeping your back straight.  Test the weight of unknown objects before attempting to lift them.  Try to keep your elbows locked down at your sides in order get the best strength from your shoulders when carrying an object.  Always ask for help when lifting heavy or awkward objects. INCORRECT LIFTING TECHNIQUES DO NOT:   Lock your knees when lifting, even if it is a small object.  Bend and twist. Pivot at your feet or move your feet when needing to change directions.  Assume that you can safely pick up even a paperclip without proper posture. Document Released: 08/02/2005 Document Revised: 10/25/2011 Document Reviewed: 11/14/2008 [facility] Patient Information 2014 [place], [place].

## 2014-01-28 NOTE — Telephone Encounter (Signed)
Noted  

## 2014-01-28 NOTE — Telephone Encounter (Signed)
Patient Information:  Caller Name: Isacc  Phone: [phone]  Patient: Joseph Greene, Joseph Greene  Gender: Male  DOB: November 29, 1965  Age: 48 Years  PCP: Alysia Penna [facility])  Office Follow Up:  Does the office need to follow up with this patient?: No  Instructions For The Office: N/A  RN Note:  Patient calling with c/o back pain since bending over last PM. c/o Difficulty walking.  Denies chest pain.  No relief with OTC medication.  Symptoms  Reason For Call & Symptoms: c/o back pain after bending over  Reviewed Health History In EMR: Yes  Reviewed Medications In EMR: Yes  Reviewed Allergies In EMR: Yes  Reviewed Surgeries / Procedures: Yes  Date of Onset of Symptoms: 01/27/2014  Treatments Tried: OTC back aid MAX  Treatments Tried Worked: No  Guideline(s) Used:  Back Pain  Disposition Per Guideline:   See Today in Office  Reason For Disposition Reached:   Can't walk or can barely walk  Advice Given:  Call Back If:  You become worse.  Patient Will Follow Care Advice:  YES  Appointment Scheduled:  01/28/2014 14:15:42 Appointment Scheduled Provider:  Roxy Cedar [facility])

## 2014-01-31 ENCOUNTER — Ambulatory Visit: Payer: 59 | Admitting: Internal Medicine

## 2014-01-31 ENCOUNTER — Other Ambulatory Visit: Payer: 59

## 2014-02-11 ENCOUNTER — Telehealth: Payer: Self-pay | Admitting: Family Medicine

## 2014-02-11 MED ORDER — HYDROCODONE-ACETAMINOPHEN 10-325 MG PO TABS: 1.0000 | ORAL_TABLET | Freq: Three times a day (TID) | ORAL | Status: DC | PRN

## 2014-02-11 NOTE — Telephone Encounter (Signed)
Wrote for [employer] Also please call in Klor-con #[id number] with 3 rf

## 2014-02-11 NOTE — Telephone Encounter (Signed)
Pt is requesting a new rx for HYDROcodone-acetaminophen (NORCO) 10-325 MG per tablet Pt states he accidentally dropped his bottle of meds in the sink full of water and need a new rx for potassium chloride sa (k-dur, flor con) 20 meq,

## 2014-02-12 NOTE — Telephone Encounter (Signed)
Rx was done for Norco by pt's PCP, Dr. Sarajane Jews, on 02/11/14

## 2014-02-12 NOTE — Telephone Encounter (Signed)
Pt stated NP wrote rx for hydrocodone

## 2014-02-13 ENCOUNTER — Ambulatory Visit: Payer: 59 | Admitting: Internal Medicine

## 2014-02-13 ENCOUNTER — Other Ambulatory Visit: Payer: 59

## 2014-02-13 MED ORDER — POTASSIUM CHLORIDE CRYS ER 20 MEQ PO TBCR: 20.0000 meq | EXTENDED_RELEASE_TABLET | Freq: Two times a day (BID) | ORAL | Status: DC

## 2014-02-13 NOTE — Telephone Encounter (Signed)
Script for Norco is ready for pick up, Potassium was sent e-scribe to [place] and I left a voice message for pt.

## 2014-04-10 ENCOUNTER — Telehealth: Payer: Self-pay | Admitting: Family Medicine

## 2014-04-10 NOTE — Telephone Encounter (Signed)
I left a voice message for pt, [facility] can ask [facility] from [place]. We just sent in a years worth of refills on 3 of his medications.

## 2014-04-10 NOTE — Telephone Encounter (Signed)
Pt request refill on all his meds and request them to be sent to the following pharmacy    New pharmacy ; [facility]

## 2014-09-26 ENCOUNTER — Other Ambulatory Visit: Payer: Self-pay

## 2014-09-26 MED ORDER — AMLODIPINE BESYLATE 10 MG PO TABS: ORAL_TABLET | ORAL | Status: DC

## 2014-09-26 MED ORDER — LOSARTAN POTASSIUM-HCTZ 100-25 MG PO TABS: ORAL_TABLET | ORAL | Status: DC

## 2014-09-26 NOTE — Addendum Note (Signed)
Addended by: Colleen Can on: 09/26/2014 11:04 AM   Modules accepted: Orders

## 2014-09-26 NOTE — Telephone Encounter (Signed)
Rx request for amlodipine besylate 10 mg tablet-Take 1 tablet by mouth every day #90

## 2014-09-26 NOTE — Telephone Encounter (Signed)
Rx request for Losartan-HCTZ 100-25 mg tablet-Take 1 tablet by mouth every day #90  Pharm:  [facility]   Rx sent to pharmacy.

## 2015-03-04 ENCOUNTER — Other Ambulatory Visit: Payer: Self-pay | Admitting: *Deleted

## 2015-03-04 MED ORDER — POTASSIUM CHLORIDE CRYS ER 20 MEQ PO TBCR: 20.0000 meq | EXTENDED_RELEASE_TABLET | Freq: Two times a day (BID) | ORAL | Status: AC

## 2015-04-09 ENCOUNTER — Telehealth: Payer: Self-pay | Admitting: Family Medicine

## 2015-04-09 NOTE — Telephone Encounter (Signed)
Pt request refill of the following: amLODipine (NORVASC) 10 MG tablet ,  losartan-hydrochlorothiazide (HYZAAR) 100-25 MG per tablet potassium chloride SA (K-DUR,KLOR-CON) 20 MEQ tablet  Pt said he is out of medicine     [facility]: [facility] [place]

## 2015-04-10 MED ORDER — LOSARTAN POTASSIUM-HCTZ 100-25 MG PO TABS: ORAL_TABLET | ORAL | Status: AC

## 2015-04-10 MED ORDER — AMLODIPINE BESYLATE 10 MG PO TABS: ORAL_TABLET | ORAL | Status: DC

## 2015-04-10 NOTE — Telephone Encounter (Signed)
rx sent and patient is aware 

## 2015-06-07 ENCOUNTER — Emergency Department
Admission: EM | Admit: 2015-06-07 | Discharge: 2015-06-07 | Disposition: A | Discharge status: Home / Self Care | Payer: 59 | Source: Home / Self Care | Attending: Family Medicine | Admitting: Family Medicine

## 2015-06-07 DIAGNOSIS — R51 Headache: Secondary | ICD-10-CM

## 2015-06-07 DIAGNOSIS — B349 Viral infection, unspecified: Secondary | ICD-10-CM

## 2015-06-07 DIAGNOSIS — R519 Headache, unspecified: Secondary | ICD-10-CM

## 2015-06-07 DIAGNOSIS — R509 Fever, unspecified: Secondary | ICD-10-CM

## 2015-06-07 NOTE — ED Notes (Signed)
Patient states that he was diagnosed with the flu last week. He states he has fever, chills and a light headache.

## 2015-06-07 NOTE — Discharge Instructions (Signed)

## 2015-06-07 NOTE — ED Provider Notes (Signed)
CSN: [id number]     Arrival date & time 06/07/15  1124 History   First MD Initiated Contact with Patient 06/07/15 1143     Chief Complaint  Patient presents with  . Fever    fever and chills    (Consider location/radiation/quality/duration/timing/severity/associated sxs/prior Treatment) HPI  Pt is a 49yo male with hx of HTN presenting to [facility] with concern for elevated blood pressure after getting the flu last week.  Triage note states he was diagnosed with the flu, however, pt states he has not been evaluated by a medical provider for his symptoms he just "knows" he has the flu due to body aches, fatigue, fever and chills.  He also reports mild to moderate sore scratchy throat.  He has had generalized headaches as well which concern him for elevated BP but he has not checked it at home.  Due to his flu-like symptoms he has been taking Thera-flu and Nyquil which he believes is making his BP elevated. Denies cough, chest pain, sore throat, abdominal pain, or n/v/d. He did not get the flu vaccine this year as he states it "always" makes him very sick.  Denies recent travel. Does note his son was sick 1-2 weeks ago and believes he caught it from him. No hx of asthma.  Past Medical History  Diagnosis Date  . HYPERTENSION 05/20/2010  . Neutrophilic leukocytosis     sees Dr. Earlie Server    History reviewed. No pertinent past surgical history. Family History  Problem Relation Age of Onset  . Diabetes    . Hypertension     Social History  Substance Use Topics  . Smoking status: Never Smoker   . Smokeless tobacco: Never Used  . Alcohol Use: No    Review of Systems  Constitutional: Positive for fever, chills and fatigue.  HENT: Positive for rhinorrhea and sore throat. Negative for congestion, ear pain, trouble swallowing and voice change.   Respiratory: Negative for cough and shortness of breath.   Cardiovascular: Negative for chest pain and palpitations.  Gastrointestinal: Negative for nausea,  vomiting, abdominal pain and diarrhea.  Musculoskeletal: Positive for myalgias and arthralgias. Negative for back pain.       Body aches  Skin: Negative for rash.  All other systems reviewed and are negative.   Allergies  Review of patient's allergies indicates no known allergies.  Home Medications   Prior to Admission medications   Medication Sig Start Date End Date Taking? Authorizing Provider  amLODipine (NORVASC) 10 MG tablet TAKE 1 TABLET BY MOUTH DAILY. 04/10/15  Yes Laurey Morale, MD  losartan-hydrochlorothiazide (HYZAAR) 100-25 MG per tablet TAKE 1 TABLET BY MOUTH DAILY. 04/10/15  Yes Laurey Morale, MD  potassium chloride SA (K-DUR,KLOR-CON) 20 MEQ tablet Take 1 tablet (20 mEq total) by mouth 2 (two) times daily. 03/04/15  Yes Laurey Morale, MD   Meds Ordered and Administered this Visit  Medications - No data to display  BP 125/84 mmHg  Pulse 113  Temp(Src) 102 F (38.9 C)  Ht 6\' 5"  (1.956 m)  Wt 288 lb (130.636 kg)  BMI 34.14 kg/m2  SpO2 98% No data found.   Physical Exam  Constitutional: He is oriented to person, place, and time. He appears well-developed and well-nourished.  HENT:  Head: Normocephalic and atraumatic.  Right Ear: External ear normal.  Left Ear: External ear normal.  Nose: Nose normal.  Mouth/Throat: Oropharynx is clear and moist.  Eyes: Conjunctivae and EOM are normal. Pupils are equal, round, and  reactive to light. No scleral icterus.  Neck: Normal range of motion. Neck supple.  Cardiovascular: Regular rhythm and normal heart sounds.  Tachycardia present.   Mild tachycardia  Pulmonary/Chest: Effort normal and breath sounds normal. No respiratory distress. He has no wheezes. He has no rales. He exhibits no tenderness.  Abdominal: Soft. He exhibits no distension. There is no tenderness.  Musculoskeletal: Normal range of motion.  Lymphadenopathy:    He has no cervical adenopathy.  Neurological: He is alert and oriented to person, place, and time.   Skin: Skin is warm and dry.  Nursing note and vitals reviewed.   ED Course  Procedures (including critical care time)  Labs Review Labs Reviewed - No data to display  Imaging Review No results found.    MDM   1. Viral illness   2. Generalized headache   3. Fever and chills     Pt concerned for elevated BP due to generalized headaches and taking OTC cold/flu medication as pt self diagnoses himself with the flu.  Pt c/o chills, subjective fever, body aches and sore throat. Temp in UC is 102.  Mild tachycardia at 113. Pt denies CP or SOB.  BP is good- 125/84 Tachycardia likely due to Fever of 102.  Pt given acetaminophen in UC.  Reassured pt on normal blood pressure.  Offered to officially test pt for influenza, pt declined. Encouraged pt to get lots of rest, stay well hydrated and alternate acetaminophen and ibuprofen to help with fever and pain (body aches)  Advised to f/u with PCP in 4-5 days if not improving, sooner if worsening. Patient verbalized understanding and agreement with treatment plan.    Noland Fordyce, PA-C 06/07/15 1200

## 2015-06-09 ENCOUNTER — Telehealth: Payer: Self-pay | Admitting: Family Medicine

## 2015-06-09 NOTE — Telephone Encounter (Signed)
[facility] - Client TELEPHONE [facility] Patient Name: Joseph Greene Gender: Male DOB: 03/26/1966 Age: 49 Y 10 M 11 D Return Phone Number: [phone] (Primary) Address: City/State/Zip: [place] Client [facility] - Client Client Site [facility] - Night Physician [place], [place] Type Call Call Type Triage / Clinical Relationship To Patient Self Return Phone Number [phone] (Primary) Chief Complaint Headache Initial Comment caller states his BP is elevated and he has a headache PreDisposition Did not know what to do Nurse Assessment Nurse: Burt Ek, RN, Tabatha Date/Time (Eastern Time): 06/07/2015 10:36:11 AM Confirm and document reason for call. If symptomatic, describe symptoms. ---Caller states his BP is elevated and he has a headache. States he does not have a machine to check the bp but gets this type of HA when his bp is elevated. States HA since yesterday. Rates pain 1/10 and does increase to 3-4/10. States he had the flu last week (didn't go to the doctor) and was taking OTC Nyquil and Theraflu. States he didn't realize he should take it with having HTN. States he took [facility] powder yesterday for the pain but only helped briefly. Has the patient traveled out of the country within the last 30 days? ---No Does the patient have any new or worsening symptoms? ---Yes Will a triage be completed? ---Yes Related visit to physician within the last 2 weeks? ---No Does the PT have any chronic conditions? (i.e. diabetes, asthma, etc.) ---Yes List chronic conditions. ---HTN Guidelines Guideline Title Affirmed Question Affirmed Notes Nurse Date/Time (Eastern Time) Headache Patient sounds very sick or weak to the triager Caller states he has had a HA since yesterday. Rates pain as mild. States he is concerned his BP is high, due to the type of HA he is having.  States he had what he thought to be the flu last week and took OTC meds. States he is concerned [place], Therapist, sports, Tabatha 06/07/2015 10:45:43 AM PLEASE NOTE: All timestamps contained within this report are represented as [place] Standard Time. CONFIDENTIALTY NOTICE: This fax transmission is intended only for the addressee. It contains information that is legally privileged, confidential or otherwise protected from use or disclosure. If you are not the intended recipient, you are strictly prohibited from reviewing, disclosing, copying using or disseminating any of this information or taking any action in reliance on or regarding this information. If you have received this fax in error, please notify us immediately by telephone so that we can arrange for its return to [place]. Phone: [phone], Toll-Free: [phone], Fax: [phone] Page: 2 of 2 Call Id: [id number] Guidelines Guideline Title Affirmed Question Affirmed Notes Nurse Date/Time Eilene Ghazi Time) this caused increase BP. Denies difficulty walking; denies chest pain; denies blurred vision. RN recommended he be evaluated due persistent HA and concern for BP. Disp. Time Eilene Ghazi Time) Disposition Final User 06/07/2015 10:32:20 AM Send To Clinical Follow Up Queue [facility] 06/07/2015 10:50:55 AM Go to ED Now (or PCP triage) Yes Burt Ek, RN, Gabriel Earing Understands: Yes Disagree/Comply: Comply Care Advice Given Per Guideline GO TO ED NOW (OR PCP TRIAGE): * IF NO PCP TRIAGE: You need to be seen. Go to the [facility] at [facility] within the next hour. Leave as soon as you can. DRIVING: Another adult should drive. CARE ADVICE given per Headache (Adult) guideline. After Care Instructions Given Call Event Type User Date / Time Description [facility]errals [facility]

## 2015-06-09 NOTE — Telephone Encounter (Signed)
According to below note pt agreed to go to Urgent Care.

## 2015-06-11 ENCOUNTER — Ambulatory Visit (INDEPENDENT_AMBULATORY_CARE_PROVIDER_SITE_OTHER): Payer: 59 | Admitting: Family Medicine

## 2015-06-11 ENCOUNTER — Encounter: Payer: Self-pay | Admitting: Family Medicine

## 2015-06-11 VITALS — BP 109/59 | HR 98 | Temp 100.2°F | Resp 16 | Ht 77.0 in | Wt 287.0 lb

## 2015-06-11 DIAGNOSIS — J029 Acute pharyngitis, unspecified: Secondary | ICD-10-CM | POA: Diagnosis not present

## 2015-06-11 DIAGNOSIS — R05 Cough: Secondary | ICD-10-CM

## 2015-06-11 DIAGNOSIS — R059 Cough, unspecified: Secondary | ICD-10-CM

## 2015-06-11 LAB — COMPLETE METABOLIC PANEL WITH GFR
ALK PHOS: 53 U/L (ref 40–115)
ALT: 17 U/L (ref 9–46)
AST: 26 U/L (ref 10–40)
Albumin: 4.2 g/dL (ref 3.6–5.1)
BILIRUBIN TOTAL: 0.7 mg/dL (ref 0.2–1.2)
BUN: 18 mg/dL (ref 7–25)
CO2: 28 mmol/L (ref 20–31)
Calcium: 8.8 mg/dL (ref 8.6–10.3)
Chloride: 92 mmol/L — ABNORMAL LOW (ref 98–110)
Creat: 2.15 mg/dL — ABNORMAL HIGH (ref 0.60–1.35)
GFR, EST AFRICAN AMERICAN: 41 mL/min — AB (ref >=60)
GFR, EST NON AFRICAN AMERICAN: 35 mL/min — AB (ref >=60)
Glucose, Bld: 148 mg/dL — ABNORMAL HIGH (ref 65–99)
POTASSIUM: 3.2 mmol/L — AB (ref 3.5–5.3)
SODIUM: 137 mmol/L (ref 135–146)
TOTAL PROTEIN: 7.6 g/dL (ref 6.1–8.1)

## 2015-06-11 LAB — CBC
HCT: 37.3 % — ABNORMAL LOW (ref 39.0–52.0)
Hemoglobin: 12.9 g/dL — ABNORMAL LOW (ref 13.0–17.0)
MCH: 27.6 pg (ref 26.0–34.0)
MCHC: 34.6 g/dL (ref 30.0–36.0)
MCV: 79.7 fL (ref 78.0–100.0)
MPV: 8.4 fL — ABNORMAL LOW (ref 8.6–12.4)
PLATELETS: 253 10*3/uL (ref 150–400)
RBC: 4.68 MIL/uL (ref 4.22–5.81)
RDW: 14.8 % (ref 11.5–15.5)
WBC: 21.7 10*3/uL — ABNORMAL HIGH (ref 4.0–10.5)

## 2015-06-11 LAB — POCT RAPID STREP A (OFFICE): Rapid Strep A Screen: NEGATIVE

## 2015-06-11 MED ORDER — IPRATROPIUM BROMIDE 0.06 % NA SOLN: 2.0000 | Freq: Four times a day (QID) | NASAL | Status: AC

## 2015-06-11 MED ORDER — GUAIFENESIN-CODEINE 100-10 MG/5ML PO SOLN: 5.0000 mL | Freq: Every evening | ORAL | Status: DC | PRN

## 2015-06-11 NOTE — Addendum Note (Signed)
Addended by: Darla Lesches T on: 06/11/2015 10:30 AM   Modules accepted: Orders

## 2015-06-11 NOTE — Patient Instructions (Signed)
Thank you for coming in today. Stop the Hyzaar for a few days.  Continue tylenol for fever. Get xray and lab work today.  I will call with results today.  Call or go to the emergency room if you get worse, have trouble breathing, have chest pains, or palpitations.     Community-Acquired Pneumonia, Adult Pneumonia is an infection of the lungs. There are different types of pneumonia. One type can develop while a person is in a hospital. A different type, called community-acquired pneumonia, develops in people who are not, or have not recently been, in the hospital or other health care facility.  CAUSES Pneumonia may be caused by bacteria, viruses, or funguses. Community-acquired pneumonia is often caused by Streptococcus pneumonia bacteria. These bacteria are often passed from one person to another by breathing in droplets from the cough or sneeze of an infected person. RISK FACTORS The condition is more likely to develop in:  People who havechronic diseases, such as chronic obstructive pulmonary disease (COPD), asthma, congestive heart failure, cystic fibrosis, diabetes, or kidney disease.  People who haveearly-stage or late-stage HIV.  People who havesickle cell disease.  People who havehad their spleen removed (splenectomy).  People who havepoor Human resources officer.  People who havemedical conditions that increase the risk of breathing in (aspirating) secretions their own mouth and nose.   People who havea weakened immune system (immunocompromised).  People who smoke.  People whotravel to areas where pneumonia-causing germs commonly exist.  People whoare around animal habitats or animals that have pneumonia-causing germs, including birds, bats, rabbits, cats, and farm animals. SYMPTOMS Symptoms of this condition include:  Adry cough.  A wet (productive) cough.  Fever.  Sweating.  Chest pain, especially when breathing deeply or coughing.  Rapid breathing or  difficulty breathing.  Shortness of breath.  Shaking chills.  Fatigue.  Muscle aches. DIAGNOSIS Your health care provider will take a medical history and perform a physical exam. You may also have other tests, including:  Imaging studies of your chest, including X-rays.  Tests to check your blood oxygen level and other blood gases.  Other tests on blood, mucus (sputum), fluid around your lungs (pleural fluid), and urine. If your pneumonia is severe, other tests may be done to identify the specific cause of your illness. TREATMENT The type of treatment that you receive depends on many factors, such as the cause of your pneumonia, the medicines you take, and other medical conditions that you have. For most adults, treatment and recovery from pneumonia may occur at home. In some cases, treatment must happen in a hospital. Treatment may include:  Antibiotic medicines, if the pneumonia was caused by bacteria.  Antiviral medicines, if the pneumonia was caused by a virus.  Medicines that are given by mouth or through an IV tube.  Oxygen.  Respiratory therapy. Although rare, treating severe pneumonia may include:  Mechanical ventilation. This is done if you are not breathing well on your own and you cannot maintain a safe blood oxygen level.  Thoracentesis. This procedureremoves fluid around one lung or both lungs to help you breathe better. HOME CARE INSTRUCTIONS  Take over-the-counter and prescription medicines only as told by your health care provider.  Only takecough medicine if you are losing sleep. Understand that cough medicine can prevent your body's natural ability to remove mucus from your lungs.  If you were prescribed an antibiotic medicine, take it as told by your health care provider. Do not stop taking the antibiotic even if you start  to feel better.  Sleep in a semi-upright position at night. Try sleeping in a reclining chair, or place a few pillows under your  head.  Do not use tobacco products, including cigarettes, chewing tobacco, and e-cigarettes. If you need help quitting, ask your health care provider.  Drink enough water to keep your urine clear or pale yellow. This will help to thin out mucus secretions in your lungs. PREVENTION There are ways that you can decrease your risk of developing community-acquired pneumonia. Consider getting a pneumococcal vaccine if:  You are older than 49 years of age.  You are older than 49 years of age and are undergoing cancer treatment, have chronic lung disease, or have other medical conditions that affect your immune system. Ask your health care provider if this applies to you. There are different types and schedules of pneumococcal vaccines. Ask your health care provider which vaccination option is best for you. You may also prevent community-acquired pneumonia if you take these actions:  Get an influenza vaccine every year. Ask your health care provider which type of influenza vaccine is best for you.  Go to the dentist on a regular basis.  Wash your hands often. Use hand sanitizer if soap and water are not available. SEEK MEDICAL CARE IF:  You have a fever.  You are losing sleep because you cannot control your cough with cough medicine. SEEK IMMEDIATE MEDICAL CARE IF:  You have worsening shortness of breath.  You have increased chest pain.  Your sickness becomes worse, especially if you are an older adult or have a weakened immune system.  You cough up blood.   This information is not intended to replace advice given to you by your health care provider. Make sure you discuss any questions you have with your health care provider.   Document Released: 08/02/2005 Document Revised: 04/23/2015 Document Reviewed: 11/27/2014 Elsevier Interactive Patient Education [employer].

## 2015-06-11 NOTE — Assessment & Plan Note (Addendum)
Concerning for community-acquired pneumonia versus influenza versus viral URI. X-ray and CBC and CMP pending stat. Treat empirically with codeine cough syrup and Atrovent nasal spray. Call patient with results.  Additionally patient has mildly low blood pressure. We'll hold on Hyzaar for a few days. Patient is currently asymptomatic from this perspective.

## 2015-06-11 NOTE — Progress Notes (Signed)
Joseph Greene is a 49 y.o. male who presents to [facility]  today for cough. Patient notes a cough congestion fevers and chills starting on around October 22. He additionally notes body aches. The cough is interfering with sleep. He was seen by urgent care who suspected influenza. He takes ibuprofen for symptoms. He notes his symptoms are persistent. He denies any lightheadedness or dizziness.   Past Medical History  Diagnosis Date  . HYPERTENSION 05/20/2010  . Neutrophilic leukocytosis     sees Dr. Earlie Server    History reviewed. No pertinent past surgical history. Social History  Substance Use Topics  . Smoking status: Never Smoker   . Smokeless tobacco: Never Used  . Alcohol Use: No   family history includes Diabetes in an other family member; Hypertension in an other family member.  ROS as above Medications: Current Outpatient Prescriptions  Medication Sig Dispense Refill  . amLODipine (NORVASC) 10 MG tablet TAKE 1 TABLET BY MOUTH DAILY. 90 tablet 1  . losartan-hydrochlorothiazide (HYZAAR) 100-25 MG per tablet TAKE 1 TABLET BY MOUTH DAILY. 90 tablet 1  . potassium chloride SA (K-DUR,KLOR-CON) 20 MEQ tablet Take 1 tablet (20 mEq total) by mouth 2 (two) times daily. 180 tablet 3  . guaiFENesin-codeine 100-10 MG/5ML syrup Take 5 mLs by mouth at bedtime as needed for cough. 120 mL 0  . ipratropium (ATROVENT) 0.06 % nasal spray Place 2 sprays into both nostrils 4 (four) times daily. 15 mL 1   No current facility-administered medications for this visit.   No Known Allergies   Exam:  BP 109/59 mmHg  Pulse 98  Temp(Src) 100.2 F (37.9 C)  Resp 16  Ht 6\' 5"  (1.956 m)  Wt 287 lb (130.182 kg)  BMI 34.03 kg/m2  SpO2 98%  Gen: Well NAD nontoxic appearing HEENT: EOMI,  MMM , nasal discharge. Normal posterior pharynx. Normal tympanic membranes. Mild cervical lymphadenopathy present bilaterally. Lungs: Normal work of breathing. Coarse breath  sounds right lower lungs Heart: RRR no MRG Abd: NABS, Soft. Nondistended, Nontender Exts: Brisk capillary refill, warm and well perfused.   Rapid strep test was negative.  No results found for this or any previous visit (from the past 24 hour(s)). No results found.   Please see individual assessment and plan sections.

## 2015-06-12 NOTE — Progress Notes (Signed)
Quick Note:  Labs are abnormal. CXR does not look like it was done. Return to clinic today or tomorrow or Monday. Get xray ASAP. Go to ER if not better. ______

## 2015-06-16 ENCOUNTER — Ambulatory Visit (INDEPENDENT_AMBULATORY_CARE_PROVIDER_SITE_OTHER): Payer: 59 | Admitting: Family Medicine

## 2015-06-16 ENCOUNTER — Encounter: Payer: Self-pay | Admitting: Family Medicine

## 2015-06-16 ENCOUNTER — Ambulatory Visit (INDEPENDENT_AMBULATORY_CARE_PROVIDER_SITE_OTHER): Payer: 59

## 2015-06-16 VITALS — BP 110/73 | HR 86 | Temp 99.0°F | Wt 288.0 lb

## 2015-06-16 DIAGNOSIS — R05 Cough: Secondary | ICD-10-CM

## 2015-06-16 DIAGNOSIS — D72829 Elevated white blood cell count, unspecified: Secondary | ICD-10-CM

## 2015-06-16 DIAGNOSIS — R7989 Other specified abnormal findings of blood chemistry: Secondary | ICD-10-CM | POA: Insufficient documentation

## 2015-06-16 DIAGNOSIS — R748 Abnormal levels of other serum enzymes: Secondary | ICD-10-CM | POA: Diagnosis not present

## 2015-06-16 DIAGNOSIS — R0989 Other specified symptoms and signs involving the circulatory and respiratory systems: Secondary | ICD-10-CM | POA: Diagnosis not present

## 2015-06-16 DIAGNOSIS — R059 Cough, unspecified: Secondary | ICD-10-CM

## 2015-06-16 LAB — COMPREHENSIVE METABOLIC PANEL
ALBUMIN: 4.2 g/dL (ref 3.6–5.1)
ALK PHOS: 49 U/L (ref 40–115)
ALT: 19 U/L (ref 9–46)
AST: 31 U/L (ref 10–40)
BUN: 22 mg/dL (ref 7–25)
CALCIUM: 8.8 mg/dL (ref 8.6–10.3)
CO2: 27 mmol/L (ref 20–31)
Chloride: 92 mmol/L — ABNORMAL LOW (ref 98–110)
Creat: 1.69 mg/dL — ABNORMAL HIGH (ref 0.60–1.35)
GLUCOSE: 120 mg/dL — AB (ref 65–99)
POTASSIUM: 3.1 mmol/L — AB (ref 3.5–5.3)
Sodium: 134 mmol/L — ABNORMAL LOW (ref 135–146)
Total Bilirubin: 0.5 mg/dL (ref 0.2–1.2)
Total Protein: 7.7 g/dL (ref 6.1–8.1)

## 2015-06-16 LAB — LACTATE DEHYDROGENASE: LDH: 269 U/L — ABNORMAL HIGH (ref 94–250)

## 2015-06-16 MED ORDER — AZITHROMYCIN 250 MG PO TABS: 250.0000 mg | ORAL_TABLET | Freq: Every day | ORAL | Status: AC

## 2015-06-16 MED ORDER — HYDROCODONE-HOMATROPINE 5-1.5 MG/5ML PO SYRP: 5.0000 mL | ORAL_SOLUTION | Freq: Three times a day (TID) | ORAL | Status: AC | PRN

## 2015-06-16 NOTE — Progress Notes (Signed)
Joseph Greene is a 49 y.o. male who presents to [facility]  today for follow-up bronchitis and elevated creatinine.  Patient was seen about a week ago for follow-up possible influenza. A chest x-ray was ordered as were labs. His labs showed elevated white blood cell count, and elevated creatinine. He never did get the chest x-ray before he left. In the interim he continues to have mild subjective fevers as well as a persistent mildly productive cough. He notes the codeine cough syrup is only marginally helpful.  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 Earlie Server in 2011 and 2014.  At the last visit patient was asked to stop taking his losartan hydrochlorothiazide for a few days as his blood pressure was slightly low. He has resumed taking all of his blood pressure medicines. He denies feeling lightheadedness or dizziness.   Past Medical History  Diagnosis Date  . HYPERTENSION 05/20/2010  . Neutrophilic leukocytosis     sees Dr. Earlie Server    No past surgical history on file. Social History  Substance Use Topics  . Smoking status: Never Smoker   . Smokeless tobacco: Never Used  . Alcohol Use: No   family history includes Diabetes in an other family member; Hypertension in an other family member.  ROS as above Medications: Current Outpatient Prescriptions  Medication Sig Dispense Refill  . amLODipine (NORVASC) 10 MG tablet TAKE 1 TABLET BY MOUTH DAILY. 90 tablet 1  . azithromycin (ZITHROMAX) 250 MG tablet Take 1 tablet (250 mg total) by mouth daily. Take first 2 tablets together, then 1 every day until finished. 6 tablet 0  . guaiFENesin-codeine 100-10 MG/5ML syrup Take 5 mLs by mouth at bedtime as needed for cough. 120 mL 0  . HYDROcodone-homatropine (HYCODAN) 5-1.5 MG/5ML syrup Take 5 mLs by mouth every 8 (eight) hours as needed for cough. 180 mL 0  . ipratropium (ATROVENT) 0.06 % nasal spray Place 2  sprays into both nostrils 4 (four) times daily. 15 mL 1  . losartan-hydrochlorothiazide (HYZAAR) 100-25 MG per tablet TAKE 1 TABLET BY MOUTH DAILY. 90 tablet 1  . potassium chloride SA (K-DUR,KLOR-CON) 20 MEQ tablet Take 1 tablet (20 mEq total) by mouth 2 (two) times daily. 180 tablet 3   No current facility-administered medications for this visit.   No Known Allergies   Exam:  BP 110/73 mmHg  Pulse 86  Temp(Src) 99 F (37.2 C) (Oral)  Wt 288 lb (130.636 kg)  SpO2 100% Gen: Well NAD HEENT: EOMI,  MMM clear nasal discharge Lungs: Normal work of breathing. CTABL Heart: RRR no MRG Abd: NABS, Soft. Nondistended, Nontender Exts: Brisk capillary refill, warm and well perfused.   No results found for this or any previous visit (from the past 24 hour(s)). Dg Chest 2 View  06/16/2015  CLINICAL DATA:  Cough and congestion with fever for 2 weeks, hypertension EXAM: CHEST  2 VIEW COMPARISON:  None FINDINGS: Normal heart size, mediastinal contours, and pulmonary vascularity. Lungs clear. No pneumothorax. Bones unremarkable. IMPRESSION: Normal exam. Electronically Signed   By: Lavonia Dana M.D.   On: 06/16/2015 09:49     Please see individual assessment and plan sections.

## 2015-06-16 NOTE — Patient Instructions (Addendum)
Thank you for coming in today. We will call with results.  Call or go to the emergency room if you get worse, have trouble breathing, have chest pains, or palpitations.   Take azithromycin antibiotics.

## 2015-06-16 NOTE — Assessment & Plan Note (Signed)
Elevated at the last check. This is likely due to combination of dehydration and angiotensin receptor blocker. Recheck creatinine today. If still elevated will discontinue Hyzaar and referred to nephrology

## 2015-06-16 NOTE — Assessment & Plan Note (Signed)
Likely bronchitis. Patient and myself are both reluctant to use prednisone at this time based on his leukocytosis. X-ray was clear. Switch to Hycodan cough syrup. Treat with azithromycin. Recheck if not better.

## 2015-06-16 NOTE — Assessment & Plan Note (Signed)
Blood cell count of 21 and is decreased from his previous trends. 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 Will recheck CBC with differential and LDH today. Consider referral back to oncology in the near future for recheck.

## 2015-06-17 LAB — CBC WITH DIFFERENTIAL/PLATELET
BASOS ABS: 0 10*3/uL (ref 0.0–0.1)
Basophils Relative: 0 % (ref 0–1)
EOS PCT: 1 % (ref 0–5)
Eosinophils Absolute: 0.4 10*3/uL (ref 0.0–0.7)
HEMATOCRIT: 36.3 % — AB (ref 39.0–52.0)
Hemoglobin: 12.3 g/dL — ABNORMAL LOW (ref 13.0–17.0)
LYMPHS ABS: 35.8 10*3/uL — AB (ref 0.7–4.0)
LYMPHS PCT: 88 % — AB (ref 12–46)
MCH: 26.7 pg (ref 26.0–34.0)
MCHC: 33.9 g/dL (ref 30.0–36.0)
MCV: 78.7 fL (ref 78.0–100.0)
MONO ABS: 2 10*3/uL — AB (ref 0.1–1.0)
MPV: 9.1 fL (ref 8.6–12.4)
Monocytes Relative: 5 % (ref 3–12)
Neutro Abs: 2.4 10*3/uL (ref 1.7–7.7)
Neutrophils Relative %: 6 % — ABNORMAL LOW (ref 43–77)
Platelets: 280 10*3/uL (ref 150–400)
RBC: 4.61 MIL/uL (ref 4.22–5.81)
RDW: 15.5 % (ref 11.5–15.5)
WBC: 40.7 10*3/uL — ABNORMAL HIGH (ref 4.0–10.5)

## 2015-06-17 LAB — PATHOLOGIST SMEAR REVIEW

## 2015-06-17 NOTE — Addendum Note (Signed)
Addended by: Gregor Hams on: 06/17/2015 01:05 PM   Modules accepted: Orders

## 2015-06-17 NOTE — Progress Notes (Signed)
Quick Note:  1) kidney function is improved but not all the way better. Return in about 2 weeks for recheck 2) White blood cell count is back up in the 40s again. I think it's reasonable for you to follow back up with oncology for further evaluation of this issue. ______

## 2015-06-18 ENCOUNTER — Telehealth: Payer: Self-pay | Admitting: Family Medicine

## 2015-06-18 NOTE — Telephone Encounter (Signed)
Called patient to clarify medication orders. He will discontinue Hyzaar for a few weeks and come in for kidney recheck. He will continue Norvasc. Additionally referred to oncology for elevated white blood cell count.

## 2015-07-21 ENCOUNTER — Ambulatory Visit: Payer: 59

## 2015-07-21 ENCOUNTER — Other Ambulatory Visit: Payer: 59

## 2015-07-21 ENCOUNTER — Ambulatory Visit (INDEPENDENT_AMBULATORY_CARE_PROVIDER_SITE_OTHER): Payer: Self-pay | Admitting: Family Medicine

## 2015-07-21 ENCOUNTER — Ambulatory Visit: Payer: 59 | Admitting: Hematology & Oncology

## 2015-07-21 DIAGNOSIS — R05 Cough: Secondary | ICD-10-CM

## 2015-07-21 NOTE — Progress Notes (Signed)
No show

## 2015-08-26 ENCOUNTER — Other Ambulatory Visit: Payer: 59

## 2015-08-26 ENCOUNTER — Encounter: Payer: 59 | Admitting: Hematology & Oncology

## 2015-08-26 ENCOUNTER — Ambulatory Visit: Payer: 59

## 2015-09-18 NOTE — Progress Notes (Signed)
This encounter was created in error - please disregard.

## 2015-09-19 ENCOUNTER — Other Ambulatory Visit: Payer: 59

## 2015-09-19 ENCOUNTER — Ambulatory Visit: Payer: 59

## 2015-09-19 ENCOUNTER — Ambulatory Visit: Payer: 59 | Admitting: Hematology & Oncology

## 2015-10-14 ENCOUNTER — Other Ambulatory Visit: Payer: Self-pay | Admitting: Family Medicine

## 2015-11-13 ENCOUNTER — Other Ambulatory Visit: Payer: Self-pay | Admitting: Family Medicine

## 2016-01-01 ENCOUNTER — Ambulatory Visit (INDEPENDENT_AMBULATORY_CARE_PROVIDER_SITE_OTHER): Payer: 59 | Admitting: Family Medicine

## 2016-01-01 DIAGNOSIS — Z5329 Procedure and treatment not carried out because of patient's decision for other reasons: Secondary | ICD-10-CM

## 2016-01-01 NOTE — Progress Notes (Signed)
No show. Follow up soon.

## 2016-01-20 ENCOUNTER — Other Ambulatory Visit: Payer: Self-pay | Admitting: Family Medicine

## 2016-03-22 ENCOUNTER — Other Ambulatory Visit: Payer: Self-pay | Admitting: Family Medicine

## 2016-03-22 NOTE — Telephone Encounter (Signed)
Can we refill these? 

## 2016-04-19 ENCOUNTER — Other Ambulatory Visit: Payer: Self-pay | Admitting: Family Medicine

## 2016-04-26 ENCOUNTER — Ambulatory Visit: Payer: 59 | Admitting: Family Medicine

## 2016-07-20 ENCOUNTER — Other Ambulatory Visit: Payer: Self-pay | Admitting: Family Medicine

## 2016-07-22 ENCOUNTER — Other Ambulatory Visit: Payer: Self-pay | Admitting: Family Medicine

## 2017-01-15 IMAGING — CR DG CHEST 2V
2 series · 2 of 2 positions shown · non-contrast
Comparison: None

CLINICAL DATA: Cough and congestion with fever for 2 weeks,
hypertension

EXAM:
CHEST  2 VIEW

[chest pa]
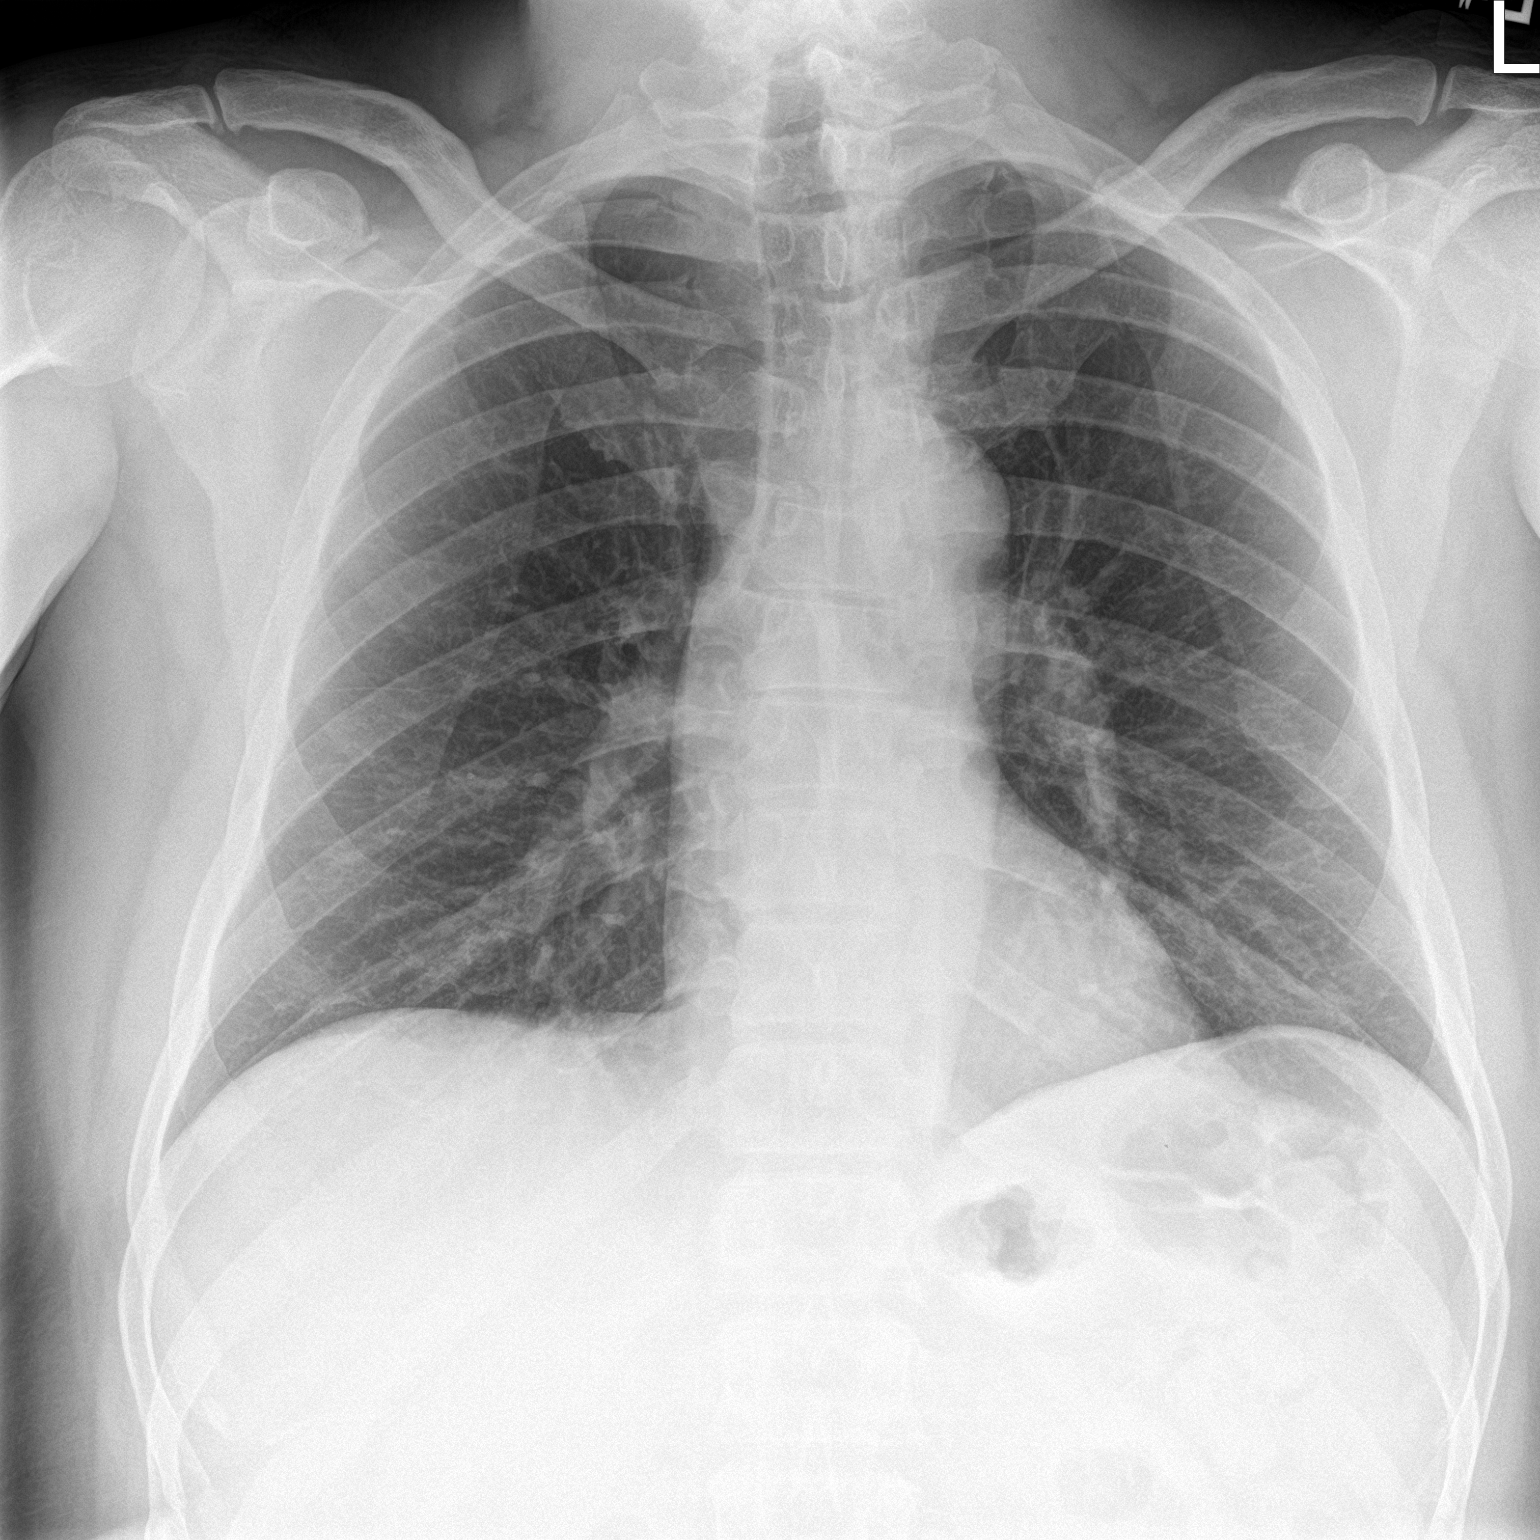

[chest lat]
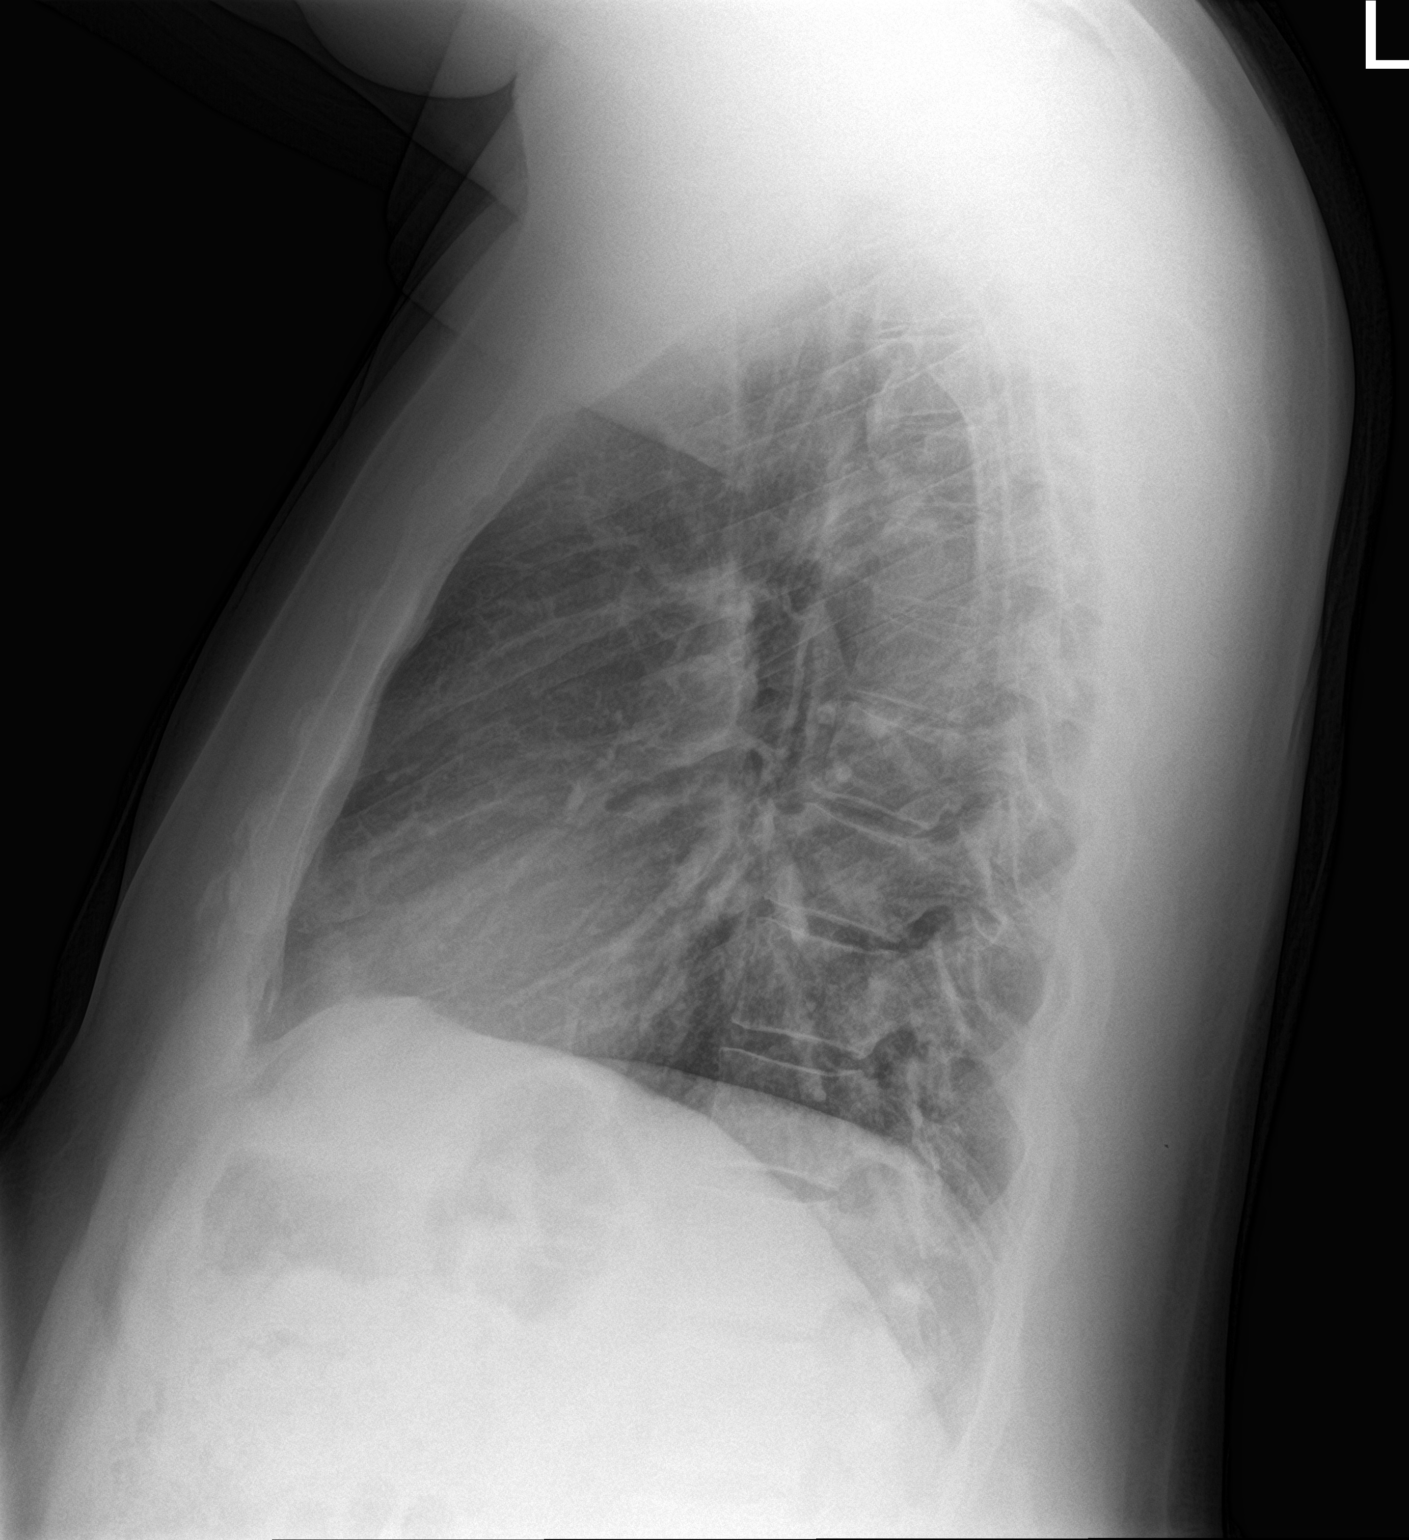

[2 of 2 positions shown; findings below may reference images not displayed]

FINDINGS: Normal heart size, mediastinal contours, and pulmonary vascularity.

Lungs clear.

No pneumothorax.

Bones unremarkable.
IMPRESSION: Normal exam.
